# Patient Record
Sex: Female | Born: 1935 | Race: Black or African American | Hispanic: No | Marital: Single | Smoking: Never smoker
Health system: Southern US, Community
[De-identification: ages and names within clinical notes are randomized; demographics above are authoritative.]

## PROBLEM LIST (undated history)

## (undated) DIAGNOSIS — K633 Ulcer of intestine: Secondary | ICD-10-CM

## (undated) DIAGNOSIS — K219 Gastro-esophageal reflux disease without esophagitis: Secondary | ICD-10-CM

## (undated) DIAGNOSIS — M170 Bilateral primary osteoarthritis of knee: Secondary | ICD-10-CM

## (undated) DIAGNOSIS — E119 Type 2 diabetes mellitus without complications: Secondary | ICD-10-CM

## (undated) DIAGNOSIS — I10 Essential (primary) hypertension: Secondary | ICD-10-CM

## (undated) DIAGNOSIS — K449 Diaphragmatic hernia without obstruction or gangrene: Secondary | ICD-10-CM

## (undated) DIAGNOSIS — J302 Other seasonal allergic rhinitis: Secondary | ICD-10-CM

## (undated) HISTORY — PX: WISDOM TOOTH EXTRACTION: SHX21

## (undated) HISTORY — DX: Bilateral primary osteoarthritis of knee: M17.0

## (undated) HISTORY — DX: Type 2 diabetes mellitus without complications: E11.9

## (undated) HISTORY — DX: Essential (primary) hypertension: I10

## (undated) HISTORY — DX: Ulcer of intestine: K63.3

## (undated) HISTORY — PX: KNEE ARTHROSCOPY: SHX127

## (undated) HISTORY — DX: Diaphragmatic hernia without obstruction or gangrene: K44.9

## (undated) HISTORY — PX: DILATION AND CURETTAGE OF UTERUS: SHX78

## (undated) HISTORY — DX: Gastro-esophageal reflux disease without esophagitis: K21.9

## (undated) HISTORY — DX: Other seasonal allergic rhinitis: J30.2

---

## 2015-06-02 ENCOUNTER — Encounter: Payer: Self-pay | Admitting: Physician Assistant

## 2015-06-02 ENCOUNTER — Telehealth: Payer: Self-pay

## 2015-06-02 NOTE — Telephone Encounter (Signed)
Patients niece called to answer questions during pr-visit call. Patient states she is not able to. Niece unsure about several things and states she will call nurse where patient is a resident to get further information.

## 2015-06-03 ENCOUNTER — Encounter: Payer: Self-pay | Admitting: Physician Assistant

## 2015-06-03 ENCOUNTER — Ambulatory Visit (INDEPENDENT_AMBULATORY_CARE_PROVIDER_SITE_OTHER): Payer: Medicare Other | Admitting: Physician Assistant

## 2015-06-03 VITALS — BP 118/68 | HR 80 | Temp 98.3°F | Resp 16 | Ht 60.0 in | Wt 127.5 lb

## 2015-06-03 DIAGNOSIS — E1142 Type 2 diabetes mellitus with diabetic polyneuropathy: Secondary | ICD-10-CM | POA: Diagnosis not present

## 2015-06-03 DIAGNOSIS — K219 Gastro-esophageal reflux disease without esophagitis: Secondary | ICD-10-CM | POA: Diagnosis not present

## 2015-06-03 DIAGNOSIS — I10 Essential (primary) hypertension: Secondary | ICD-10-CM | POA: Diagnosis not present

## 2015-06-03 DIAGNOSIS — K449 Diaphragmatic hernia without obstruction or gangrene: Secondary | ICD-10-CM

## 2015-06-03 DIAGNOSIS — F0390 Unspecified dementia without behavioral disturbance: Secondary | ICD-10-CM

## 2015-06-03 DIAGNOSIS — M170 Bilateral primary osteoarthritis of knee: Secondary | ICD-10-CM

## 2015-06-03 DIAGNOSIS — F0393 Unspecified dementia, unspecified severity, with mood disturbance: Secondary | ICD-10-CM

## 2015-06-03 MED ORDER — ACETAMINOPHEN 325 MG PO TABS: 650.0000 mg | ORAL_TABLET | Freq: Three times a day (TID) | ORAL | Status: DC | PRN

## 2015-06-03 NOTE — Progress Notes (Signed)
Pre visit review using our clinic review tool, if applicable. No additional management support is needed unless otherwise documented below in the visit note/SLS  

## 2015-06-09 NOTE — Progress Notes (Signed)
Patient presents to clinic today to establish care.  Acute Concerns: Patient c/o bilateral knee pain over the past couple of weeks after starting physical therapy. Patient with history of gait imbalance due to neuropathy and chronic osteoarthritis of knees bilaterally. Is taking tylenol rarely for pain.  Chronic Issues: Diabetes Mellitus II with Neuropathy -- Patient endorses taking Januvia 50 mg daily. Is also taking Losartan 100 mg daily. Is not checking sugars. Patient endorses last A1C obtained 2 weeks ago from previous PCP. Will need records. Patient with history of neuropathy causing numbness in her lower legs. Denies falls or problems with sleep. Is not on medication for this and does not want to start another medication. Is not smoking. Denies hc of retinopathy or CKD from diabetes. Endorses flu shot, Prevnar and Zostavax given 3 weeks ago at her Assisted Living community [facility])  GERD with hiatal hernia -- Endorses acid reflux still at bedtime. Is prescribed Ranitidine 150 mg to use twice daily but is not taking as directed. Is eating right before bed. Is requesting referral to Gastroenterology due to history of hiatal hernia.  Hypertension -- Currently on Losartan 100 mg daily, Toproil XL 25 mg daily and 81 mg ASA daily. Patient denies chest pain, palpitations, lightheadedness, dizziness, vision changes or frequent headaches.  BP: 118/68 mmHg   Dementia -- Alert and Oriented x 3 per daughter. Mild issues mainly with recent memory. Is currently on Aricept 5 mg QHS. Daughter states this has most definitely been helping patient.   Health Maintenance: Dental -- Vision -- Immunizations -- Colonoscopy -- Mammogram -- PAP --  Bone Density --    Past Medical History  Diagnosis Date  . Diabetes ([place])   . GERD (gastroesophageal reflux disease)   . Idiopathic small intestinal ulcers   . Seasonal allergies   . Hypertension   . Hiatal hernia   . Degenerative arthritis  of knee, bilateral     Past Surgical History  Procedure Laterality Date  . Dilation and curettage of uterus      x3  . Knee arthroscopy      left  . Wisdom tooth extraction      Current Outpatient Prescriptions on File Prior to Visit  Medication Sig Dispense Refill  . aspirin 81 MG tablet Take 81 mg by mouth daily.    Marland Kitchen donepezil (ARICEPT) 5 MG tablet Take 5 mg by mouth at bedtime.    Marland Kitchen losartan (COZAAR) 100 MG tablet Take 100 mg by mouth daily.    . metoprolol succinate (TOPROL-XL) 25 MG 24 hr tablet Take 25 mg by mouth daily.    . polyethylene glycol (MIRALAX / GLYCOLAX) packet Take 17 g by mouth daily.    . ranitidine (ZANTAC) 150 MG tablet Take by mouth 2 (two) times daily.    . sertraline (ZOLOFT) 25 MG tablet Take 25 mg by mouth daily.    . sitaGLIPtin (JANUVIA) 50 MG tablet Take 50 mg by mouth daily.    Marland Kitchen torsemide (DEMADEX) 10 MG tablet Take 10 mg by mouth daily as needed.      No current facility-administered medications on file prior to visit.    No Known Allergies  Family History  Problem Relation Age of Onset  . Hypertension Mother   . Prostate cancer Father   . Diabetes Mother   . Diabetes Maternal Grandfather   . Tuberculosis Paternal Uncle   . Diabetes Sister     x3  . Stroke Sister  x2  . Arthritis Sister     x2  . Breast cancer Sister     x2  . Hypertension Sister     x5    Social History   Social History  . Marital Status: Single    Spouse Name: N/A  . Number of Children: N/A  . Years of Education: N/A   Occupational History  . Nurse    Social History Main Topics  . Smoking status: Never Smoker   . Smokeless tobacco: Not on file  . Alcohol Use: Not on file  . Drug Use: Not on file  . Sexual Activity: Not on file   Other Topics Concern  . Not on file   Social History Narrative   Review of Systems  Constitutional: Negative for fever and weight loss.  HENT: Negative for ear discharge, ear pain, hearing loss and tinnitus.     Eyes: Negative for blurred vision, double vision, photophobia and pain.  Respiratory: Negative for cough and shortness of breath.   Cardiovascular: Negative for chest pain and palpitations.  Gastrointestinal: Negative for heartburn, nausea, vomiting, abdominal pain, diarrhea, constipation, blood in stool and melena.  Genitourinary: Negative for dysuria, urgency, frequency, hematuria and flank pain.  Musculoskeletal: Positive for joint pain. Negative for falls.  Neurological: Negative for dizziness, loss of consciousness and headaches.  Endo/Heme/Allergies: Negative for environmental allergies.  Psychiatric/Behavioral: Negative for depression, suicidal ideas, hallucinations and substance abuse. The patient is not nervous/anxious and does not have insomnia.     BP 118/68 mmHg  Pulse 80  Temp(Src) 98.3 F (36.8 C) (Oral)  Resp 16  Ht 5' (1.524 m)  Wt 127 lb 8 oz (57.834 kg)  BMI 24.90 kg/m2  SpO2 100%  Physical Exam  Constitutional: She is oriented to person, place, and time and well-developed, well-nourished, and in no distress.  HENT:  Head: Normocephalic and atraumatic.  Right Ear: Tympanic membrane, external ear and ear canal normal.  Left Ear: Tympanic membrane, external ear and ear canal normal.  Nose: Nose normal. No mucosal edema.  Mouth/Throat: Uvula is midline, oropharynx is clear and moist and mucous membranes are normal. No oropharyngeal exudate or posterior oropharyngeal erythema.  Eyes: Conjunctivae are normal. Pupils are equal, round, and reactive to light.  Neck: Neck supple. No thyromegaly present.  Cardiovascular: Normal rate, regular rhythm, normal heart sounds and intact distal pulses.   Pulmonary/Chest: Effort normal and breath sounds normal. No respiratory distress. She has no wheezes. She has no rales.  Abdominal: Soft. Bowel sounds are normal. She exhibits no distension and no mass. There is no tenderness. There is no rebound and no guarding.  Lymphadenopathy:     She has no cervical adenopathy.  Neurological: She is alert and oriented to person, place, and time. No cranial nerve deficit.  Skin: Skin is warm and dry. No rash noted.  Psychiatric: Affect normal.  Vitals reviewed.   No results found for this or any previous visit (from the past 2160 hour(s)).  Assessment/Plan: Essential hypertension, benign Stable. Asymptomatic. Will continue current regimen. Will obtain records from previous PCP for review.   Gastroesophageal reflux disease without esophagitis GERD diet discussed. No late-night evening. Resume Ranitidine BID as directed. Referral placed to GI due to patient request.  Primary osteoarthritis of both knees Discussed PT as cause of exacerbation. Discussed supportive measures -- RICE. Patient declines RX pain medication. Recommended ES Tylenol and topical Aspercreme.  Dementia, senile with depression Continue Aricept and Sertraline daily.  Diabetes ([place]) Up-to-date on immunizations.  Is on ARB therapy. BP well-controlled. Continue current regimen. Will obtain records from previous PCP to review.

## 2015-06-14 ENCOUNTER — Encounter: Payer: Self-pay | Admitting: Physician Assistant

## 2015-06-14 DIAGNOSIS — E119 Type 2 diabetes mellitus without complications: Secondary | ICD-10-CM | POA: Insufficient documentation

## 2015-06-14 DIAGNOSIS — F0393 Unspecified dementia, unspecified severity, with mood disturbance: Secondary | ICD-10-CM | POA: Insufficient documentation

## 2015-06-14 DIAGNOSIS — F0390 Unspecified dementia without behavioral disturbance: Secondary | ICD-10-CM | POA: Insufficient documentation

## 2015-06-14 DIAGNOSIS — I10 Essential (primary) hypertension: Secondary | ICD-10-CM | POA: Insufficient documentation

## 2015-06-14 DIAGNOSIS — K219 Gastro-esophageal reflux disease without esophagitis: Secondary | ICD-10-CM | POA: Insufficient documentation

## 2015-06-14 DIAGNOSIS — M170 Bilateral primary osteoarthritis of knee: Secondary | ICD-10-CM | POA: Insufficient documentation

## 2015-06-14 DIAGNOSIS — K449 Diaphragmatic hernia without obstruction or gangrene: Secondary | ICD-10-CM | POA: Insufficient documentation

## 2015-06-14 NOTE — Assessment & Plan Note (Signed)
GERD diet discussed. No late-night evening. Resume Ranitidine BID as directed. Referral placed to GI due to patient request.

## 2015-06-14 NOTE — Assessment & Plan Note (Signed)
Continue Aricept and Sertraline daily.

## 2015-06-14 NOTE — Assessment & Plan Note (Signed)
Stable. Asymptomatic. Will continue current regimen. Will obtain records from previous PCP for review.

## 2015-06-14 NOTE — Assessment & Plan Note (Signed)
Discussed PT as cause of exacerbation. Discussed supportive measures -- RICE. Patient declines RX pain medication. Recommended ES Tylenol and topical Aspercreme.

## 2015-06-14 NOTE — Assessment & Plan Note (Signed)
Up-to-date on immunizations. Is on ARB therapy. BP well-controlled. Continue current regimen. Will obtain records from previous PCP to review.

## 2015-06-15 ENCOUNTER — Encounter: Payer: Self-pay | Admitting: Gastroenterology

## 2015-06-16 ENCOUNTER — Telehealth: Payer: Self-pay | Admitting: Physician Assistant

## 2015-06-16 DIAGNOSIS — M170 Bilateral primary osteoarthritis of knee: Secondary | ICD-10-CM

## 2015-06-16 NOTE — Telephone Encounter (Signed)
Referral has been placed, please inform caller/SLS Thanks.

## 2015-06-16 NOTE — Telephone Encounter (Signed)
Patients niece called and and told one of the schedulers that Christine Copeland was supposed to have a referral to ortho.  I dont see one  Please advise

## 2015-06-17 ENCOUNTER — Telehealth: Payer: Self-pay | Admitting: Physician Assistant

## 2015-06-17 DIAGNOSIS — E1142 Type 2 diabetes mellitus with diabetic polyneuropathy: Secondary | ICD-10-CM

## 2015-06-17 NOTE — Telephone Encounter (Signed)
Tylenol was ordered and sent to pharmacy given to me by niece. Also added to her list of medications to be given at the facility. They need to check with pharmacy.  Ok to send order over for 20/30 grade compression stockings although I did hand write prescription and give to niece at time of visit along with nursing home papers to give to the staff there.

## 2015-06-17 NOTE — Telephone Encounter (Signed)
LMOM with contact name and number [for return call, if needed] RE: Tylenol Rx to pharmacy and Compression Stocking handwritten Rx given to niece at patient's office visit with facility paperwork; faxed new Rx for compression stockings to [facility]

## 2015-06-17 NOTE — Telephone Encounter (Signed)
Caller name: [name]  Relationship to patient: Nursing home RN   Can be reached: [phone]  Fax [phone]  Reason for call: She says that she was told by pt that PCP prescribed compression stockings and also Tylenol for her. She would like to have someone to fu with her if possible so that she can get order in to pharmacy for pt.

## 2015-06-19 MED ORDER — ACETAMINOPHEN 325 MG PO TABS: 650.0000 mg | ORAL_TABLET | Freq: Three times a day (TID) | ORAL | Status: DC | PRN

## 2015-06-19 NOTE — Addendum Note (Signed)
Addended by: Rockwell Germany on: 06/19/2015 02:30 PM   Modules accepted: Orders

## 2015-06-19 NOTE — Telephone Encounter (Signed)
Received compression stocking order and pharmacy received Rx for Tylenol. [name] is requesting Rx for Tylenol please fax over to [facility] [phone] so the Rx can be placed with the MAR.

## 2015-06-19 NOTE — Telephone Encounter (Signed)
RX for tylenol faxed to requesting facility/SLS

## 2015-06-29 ENCOUNTER — Ambulatory Visit (INDEPENDENT_AMBULATORY_CARE_PROVIDER_SITE_OTHER): Payer: Medicare Other | Admitting: Gastroenterology

## 2015-06-29 ENCOUNTER — Encounter: Payer: Self-pay | Admitting: Gastroenterology

## 2015-06-29 VITALS — BP 102/60 | HR 72 | Ht 61.0 in | Wt 125.0 lb

## 2015-06-29 DIAGNOSIS — K219 Gastro-esophageal reflux disease without esophagitis: Secondary | ICD-10-CM | POA: Diagnosis not present

## 2015-06-29 DIAGNOSIS — R1310 Dysphagia, unspecified: Secondary | ICD-10-CM

## 2015-06-29 NOTE — Progress Notes (Signed)
Christine Copeland    [id number]    25-Aug-1993  Primary Care Physician:Martin, Sandria Manly, PA-C  Referring Physician: Brunetta Jeans, PA-C [address] [place], [place] [address]  Chief complaint:  GERD, dysphagia HPI: 81 year old African-American female here for new patient visit with complaints of intermittent solid dysphagia for past few months. She does snack laying down in bed, and sometimes has difficulty swallowing and chokes. She also reported that she is having some issue with her dentures has an appointment with her dentist tomorrow. Denies any symptoms of food impaction, occasional  regurgitation. No abdominal pain, nausea or vomiting. She has chronic history of GERD, is taking ranitidine at bedtime on most days but sometimes her aides forget to give it to her. Her GERD symptoms are well controlled with Ranitidine, denies heartburn when she takes it.   Outpatient Encounter Prescriptions as of 06/29/2015  Medication Sig  . acetaminophen (TYLENOL) 325 MG tablet Take 2 tablets (650 mg total) by mouth every 8 (eight) hours as needed for moderate pain.  Marland Kitchen aspirin 81 MG tablet Take 81 mg by mouth daily.  . Calcium Carbonate-Vitamin D (CALTRATE 600+D) 600-400 MG-UNIT tablet Take 1 tablet by mouth daily.  Marland Kitchen donepezil (ARICEPT) 5 MG tablet Take 5 mg by mouth at bedtime.  . fluticasone (FLONASE) 50 MCG/ACT nasal spray Place 1 spray into both nostrils daily.  Marland Kitchen ibuprofen (ADVIL,MOTRIN) 200 MG tablet Take 200 mg by mouth every 6 (six) hours as needed.  Marland Kitchen losartan (COZAAR) 100 MG tablet Take 100 mg by mouth daily.  . metoprolol succinate (TOPROL-XL) 25 MG 24 hr tablet Take 25 mg by mouth daily.  . polyethylene glycol (MIRALAX / GLYCOLAX) packet Take 17 g by mouth daily.  . ranitidine (ZANTAC) 150 MG tablet Take by mouth 2 (two) times daily.  . sertraline (ZOLOFT) 25 MG tablet Take 25 mg by mouth daily.  . sitaGLIPtin (JANUVIA) 50 MG tablet Take 50 mg by mouth  daily.  Marland Kitchen torsemide (DEMADEX) 10 MG tablet Take 10 mg by mouth daily as needed.    No facility-administered encounter medications on file as of 06/29/2015.    Allergies as of 06/29/2015  . (No Known Allergies)    Past Medical History  Diagnosis Date  . Diabetes ([place])   . GERD (gastroesophageal reflux disease)   . Idiopathic small intestinal ulcers   . Seasonal allergies   . Hypertension   . Hiatal hernia   . Degenerative arthritis of knee, bilateral     Past Surgical History  Procedure Laterality Date  . Dilation and curettage of uterus      x3  . Knee arthroscopy      left  . Wisdom tooth extraction      Family History  Problem Relation Age of Onset  . Hypertension Mother   . Prostate cancer Father   . Diabetes Mother   . Diabetes Maternal Grandfather   . Tuberculosis Paternal Uncle   . Diabetes Sister     x3  . Stroke Sister     x2  . Arthritis Sister     x2  . Breast cancer Sister     x2  . Hypertension Sister     x5    Social History   Social History  . Marital Status: Single    Spouse Name: N/A  . Number of Children: 0  . Years of Education: N/A   Occupational History  .  retired Marine scientist    Social History Main Topics  . Smoking status: Never Smoker   . Smokeless tobacco: Not on file  . Alcohol Use: Not on file  . Drug Use: Not on file  . Sexual Activity: Not on file   Other Topics Concern  . Not on file   Social History Narrative      Review of systems: Review of Systems  Constitutional: Negative for fever and chills.  HENT: Negative.   Eyes: Negative for blurred vision.  Respiratory: Negative for cough, shortness of breath and wheezing.   Cardiovascular: Negative for chest pain and palpitations.  Gastrointestinal: as per HPI Genitourinary: Negative for dysuria, urgency, frequency and hematuria.  Musculoskeletal: Positive for myalgias, back pain and joint pain.  Skin: Negative for itching and rash.  Neurological: Negative for  dizziness, tremors, focal weakness, seizures and loss of consciousness.  Endo/Heme/Allergies: Negative for environmental allergies.  Psychiatric/Behavioral: Negative for depression, suicidal ideas and hallucinations.  All other systems reviewed and are negative.   Physical Exam: Filed Vitals:   06/29/15 1531  BP: 102/60  Pulse: 72   Gen:      No acute distress, elederly HEENT:  EOMI, sclera anicteric Neck:     No masses; no thyromegaly Lungs:    Clear to auscultation bilaterally; normal respiratory effort CV:         Regular rate and rhythm; no murmurs Abd:      + bowel sounds; soft, non-tender; no palpable masses, no distension, mid line scar Ext:    No edema; adequate peripheral perfusion Skin:      Warm and dry; no rash Neuro: alert and oriented x 3 Psych: normal mood and affect     Assessment and Plan/Recommendations:  81 year old African-American female who is currently at an assisted living facility with complaints of intermittent solid food dysphagia. Advised patient to avoid eating meals laying down and and meals 3 hours before bedtime Sleep with head end elevation Continue ranitidine 300 mg at bedtime We'll schedule for barium esophagram to evaluate the dysphagia and based on the findings we'll consider upper endoscopy Soft diet until her dentures are fixed Return after barium esophagram  Raliegh Ip Denzil Magnuson , MD [phone] Mon-Fri 8a-5p [phone] after 5p, weekends, holidays

## 2015-06-29 NOTE — Patient Instructions (Addendum)
You have been scheduled for a Barium Esophogram at [facility] (1st floor of the hospital) on  07/08/2015 at 9:30am. Please arrive 15 minutes prior to your appointment for registration. Make certain not to have anything to eat or drink 6 hours prior to your test. If you need to reschedule for any reason, please contact radiology at [phone] to do so. __________________________________________________________________ A barium swallow is an examination that concentrates on views of the esophagus. This tends to be a double contrast exam (barium and two liquids which, when combined, create a gas to distend the wall of the oesophagus) or single contrast (non-ionic iodine based). The study is usually tailored to your symptoms so a good history is essential. Attention is paid during the study to the form, structure and configuration of the esophagus, looking for functional disorders (such as aspiration, dysphagia, achalasia, motility and reflux) EXAMINATION You may be asked to change into a gown, depending on the type of swallow being performed. A radiologist and radiographer will perform the procedure. The radiologist will advise you of the type of contrast selected for your procedure and direct you during the exam. You will be asked to stand, sit or lie in several different positions and to hold a small amount of fluid in your mouth before being asked to swallow while the imaging is performed .In some instances you may be asked to swallow barium coated marshmallows to assess the motility of a solid food bolus. The exam can be recorded as a digital or video fluoroscopy procedure. POST PROCEDURE It will take 1-2 days for the barium to pass through your system. To facilitate this, it is important, unless otherwise directed, to increase your fluids for the next 24-48hrs and to resume your normal diet.  This test typically takes about 30 minutes to  perform. __________________________________________________________________________________   Dysphagia Diet Level 1, Pureed The dysphasia level 1 diet includes foods that are completely pureed and smooth. The foods have a pudding-like texture, such as the texture of pureed pancakes, mashed potatoes, and yogurt. The diet does not include foods with lumps or coarse textures. Liquids should be smooth and may either be thin, nectar-thick, honey-like, or spoon-thick. This diet is helpful for people with moderate to severe swallowing problems. It reduces the risk of food getting caught in the windpipe, trachea, or lungs. You may need help or supervision during meals while following this diet. WHAT DO I NEED TO KNOW ABOUT THIS DIET? Foods  You may eat foods that are soft and have a pudding-like texture. If a food does not have this texture, you may be able to eat the food after:  Pureeing it. This can be done with a blender or whisk.  Moistening it with liquid. For example, you may have bread if you soak it in milk or syrup.  Avoid foods that are hard, dry, sticky, chunky, lumpy, or stringy. Also avoid foods with nuts, seeds, raisins, skins, and pulp.  Do not eat foods that you have to chew. If you have to chew the food, then you cannot eat it.  Eat a variety of foods to get all the nutrients you need. Liquids  You may drink liquids that are smooth. Your health care provider will tell you if you should drink thin or thickened liquids.  To thicken a liquid, use a food and beverage thickener or a thickening food. Thickened liquids are usually a "pudding-like" consistency.  Thin liquids include fruit juices, milk, coffee, tea, yogurts, shakes, and similar foods  that melt to thin liquid at room temperature.  Avoid liquids with seeds, pulp, or chunks. See your dietitian or health care provider regularly for help with your dietary changes. WHAT FOODS CAN I EAT? Grains Store-bought soft breads,  pancakes, and [place] toast that have a smooth, moist texture and do not have nuts or seeds (you will need to moisten the food with liquid). Cooked cereals that have a pudding-like consistency, such as cream of wheat or farina (no oatmeal). Pureed, well-cooked pasta, rice, and plain bread stuffing. Vegetables Pureed vegetables. Soft avocado. Smooth tomato paste or sauce. Strained or pureed soups (these may need to be thickened as directed). Mashed or pureed potatoes without skin (can be seasoned with butter, smooth gravy, margarine, or sour cream). Fruits Pureed fruits such as melons and apples without seeds or pulp. Mashed bananas. Smooth tomato paste or sauce. Fruit juices without pulp or seeds. Strained or pureed soups. Meat and Other Protein Sources Pureed meat. Smooth pate or liverwurst. Smooth souffles. Pureed beans (such as lentils). Pureed eggs. Dairy Yogurt. Smooth cheese sauces. Milk (may need to be thickened). Nutritional dairy drinks or shakes. Ask your health care provider whether you can have ice cream. Condiments Finely ground salt, pepper, and other ground spices. Sweets/Desserts Smooth puddings and custards. Pureed desserts. Souffles. Whipped topping. Ask your health care provider whether you can have frozen desserts. Fats and Oils Butter. Margarine. Smooth and strained gravy. Sour cream. Mayonnaise. Cream cheese. Whipped topping. Smooth sauces (such as white sauce, cheese sauce, or hollandaise sauce). The items listed above may not be a complete list of recommended foods or beverages. Contact your dietitian for more options. WHAT FOODS ARE NOT RECOMMENDED? Grains Oatmeal. Dry cereals. Hard breads. Vegetables Whole vegetables. Stringy vegetables (such as celery). Thin tomato sauce. Fruits Whole fresh, frozen, canned, or dried fruits that have not been pureed. Stringy fruits (such as pineapple). Meat and Other Protein Sources Whole or ground meat, fish, or poultry. Dried or  cooked lentils or legumes that have been cooked but not mashed or pureed. Non-pureed eggs. Nuts and seeds. Peanut butter. Dairy Non-pureed cheese. Dairy products with lumps or chunks. Ask your health care provider whether you can have ice cream. Condiments Coarse or seeded herbs and spices. Sweets/Desserts [place] preserves. Jams with seeds. Solid desserts. Sticky, chewy sweets (such as licorice and caramel). Ask your health care provider whether you can have frozen desserts. Fats and Oils Sauces of fats with lumps or chunks. The items listed above may not be a complete list of foods and beverages to avoid. Contact your dietitian for more information.   This information is not intended to replace advice given to you by your health care provider. Make sure you discuss any questions you have with your health care provider.   Document Released: 07/18/2005 Document Revised: 08/08/2014 Document Reviewed: 07/01/2013 Elsevier Interactive Patient Education 2016 Elsevier Inc.   Dysphagia Diet Level 2, Mechanically Altered The dysphagia level 2 diet includes foods that are blended, chopped, ground, or mashed so they are easier to chew and swallow. The foods are soft, moist, and can be chopped into -inch chunks (such as pancakes, pasta, and bananas). In order to be on this diet, you must be able to chew. This diet helps you transition between the pureed textures of the dysphagia level 1 diet to more solid textures. This diet is helpful for people with mild to moderate swallowing difficulties. It reduces the risk of food getting caught in the windpipe, trachea, or lungs.  You may need help or supervision during meals while following this diet so that you eat safely. You will be on this diet until your health care provider advances the texture of your diet.  WHAT DO I NEED TO KNOW ABOUT THIS DIET? Foods  You may eat foods that are soft and moist.  You may need to use a blender, whisk, or masher to  soften some of your foods.  You can moisten foods with gravies, sauces, vegetable or fruit juice, milk, half and half, or water when blending, mashing, or grinding your foods to the right consistency.  If you were on the dysphagia level 1 diet, you may still eat any of the foods included in that diet.  Avoid foods that are dry, hard, sticky, chewy, coarse, and crunchy. Also avoid large cuts of food.  Take small bites. Each bite should contain  inch or less of food. Liquids  Avoid liquids with seeds and chunks.  Thicken liquids, if instructed by your health care provider. Your health care provider will tell you the consistency to which you should thicken your liquids for safe swallowing. To thicken a liquid, use a commercial thickener or a thickening food (such as rice cereal or potato flakes). Ask your health care provider for specific recommendations on thickeners. See your dietitian or health care provider regularly for help with your dietary changes. WHAT FOODS CAN I EAT? Grains Store-bought soft breads that do not have nuts or seeds. Pancakes, sweet rolls, [place] pastries, and [place] toast that have been moistened with syrup or sauce to form a slurry when blended. Well-cooked pasta, noodles, and bread dressing. Well-cooked noodles and pasta in sauce. Moist macaroni and cheese. Soft dumplings or spaetzle with gravy or butter. Cooked cereals (including oatmeal). Low-texture dry cereals, such as rice puff, corn, or wheat-flake cereals, with milk (if thin liquids are not allowed, make sure all of the milk is absorbed by the cereal before eating it). Vegetables Very soft, well-cooked vegetables in pieces less than  inch in size. Cooked potatoes that are moist, not crispy, and with sauce. Fruits Canned or cooked fruits that are soft or moist and do not have skin or seeds. Fresh, soft bananas. Fruit juices with a small amount of pulp (if thin liquids are allowed). Gelatin or plain gelatin with  canned fruit, except pineapple. Meat and Other Protein Sources Tender, moist meats, poultry, or fish cooked with gravy or sauce and cubed to -inch bites or smaller. Ground meat. Moist meatball or meatloaf. Fish without bones. Moist casseroles without rice. Tuna, egg, or meat salad without chunks or hard-to-chew vegetables, such as celery and onions. Smooth quiche without large chunks. Scrambled, poached, or soft-cooked eggs with butter, margarine, sauce, or gravy. Tofu. Well-cooked, moistened and mashed beans, peas, baked beans, and other legumes. Casseroles without rice (such as tuna noodle casserole or soft moist meat lasagna). Dairy Cream cheese. Yogurt. Cottage cheese. Ask your health care provider if milk is allowed. Sweets/Desserts Pudding. Custard. Soft fruit pies with crust on the bottom only. Crisps and cobblers without seeds or nuts and with soft crusts. Soft, moist cakes. Icing. Pre-gelled cookies. Soft, moist cookies dunked in milk, coffee, or another liquid. Jelly. Soft, smooth chocolate bars that are easily chewed. Jams and preserves without seeds. Ask your health care provider whether you can have frozen desserts. Fats and Oils Butter. Margarine. Cream for cereal, depending on liquid consistency allowed. Gravy. Cream sauces. Mayonnaise. Salad dressings. Cream cheese. Cheese spreads, plain or with soft fruits  or vegetables added. Sour cream. Sour cream dips with soft fruits or vegetables added. Whipped toppings. Other Sauces and salsas that have soft chunks that are about  inch or smaller. The items listed above may not be a complete list of recommended foods or beverages. Contact your dietitian for more options. WHAT FOODS ARE NOT RECOMMENDED? Grains All breads not listed in the recommended list. Breads that are hard or have nuts or seeds. Coarse cereals. Cereals that have nuts, seeds, dried fruits, or coconut. Rice. Corn. Vegetables Whole, raw, frozen, or dried vegetables. Tough,  fibrous, chewy, or stringy cooked vegetables, such as celery, peas, broccoli, cabbage, Brussels sprouts, and asparagus. Potato skins. Potato and other vegetable chips. Fried or French-fried potatoes. Cooked corn and peas. Fruits Whole raw, frozen, or dried fruits, including coconut. Pineapple. Fruits with seeds. Meat and Other Protein Sources Dry, tough meats, such as bacon, sausage, and hot dogs. Cheese slices and cubes. Peanut butter. Hard boiled or fried eggs. Nuts. Seeds. Pizza. Sandwiches. Dry casseroles or casseroles with rice or large chunks. Dairy Yogurt with nuts, seeds, or large chunks. Sweets/Desserts Coarse, hard, chewy, or sticky desserts. Any dessert with nuts, seeds, coconut, pineapple, or dried fruit. Ask your health care provider whether you can have frozen desserts. Fats and Oils Avoid fats with chunky, large textures, such as those with nuts or fruits. Other Soups and casseroles with large chunks. The items listed above may not be a complete list of foods and beverages to avoid. Contact your dietitian for more information.   This information is not intended to replace advice given to you by your health care provider. Make sure you discuss any questions you have with your health care provider.   Document Released: 07/18/2005 Document Revised: 08/08/2014 Document Reviewed: 07/01/2013 Elsevier Interactive Patient Education 2016 Elsevier Inc.  Dysphagia Diet Level 3, Mechanically Advanced The dysphagia level 3 diet includes foods that are soft, moist, and can be chopped into 1-inch chunks. This diet is helpful for people with mild swallowing difficulties. It reduces the risk of food getting caught in the windpipe, trachea, or lungs. WHAT DO I NEED TO KNOW ABOUT THIS DIET?  You may eat foods that are soft and moist.  If you were on the dysphagia level 1 or level 2 diets, you may eat any of the foods included on those lists.  Avoid foods that are dry, hard, sticky, chewy,  coarse, and crunchy. Also avoid large cuts of food.  Take small bites. Each bite should contain 1 inch or less of food.  Thicken liquids if instructed by your health care provider. Follow your health care provider's instructions on how to do this and to what consistency.  See your dietitian or speech language pathologist regularly for help with your dietary changes. WHAT FOODS CAN I EAT? Grains Moist breads without nuts or seeds. Biscuits, muffins, pancakes, and waffles well-moistened with syrup, jelly, margarine, or butter. Smooth cereals with plenty of milk to moisten them. Moist bread stuffing. Moist rice. Vegetables All cooked, soft vegetables. Shredded lettuce. Tender fried potatoes. Fruits All canned and cooked fruits. Soft, peeled fresh fruits, such as peaches, nectarines, kiwis, cantaloupe, honeydew melon, and watermelon without seeds. Soft berries, such as strawberries. Meat and Other Protein Sources Moist ground or finely diced or sliced meats. Solid, tender cuts of meat. Meatloaf. Hamburger with a bun. Sausage patty. Deli thin-sliced lunch meat. Chicken, egg, or tuna salad sandwich. Sloppy joe. Moist fish. Eggs prepared any way. Casseroles with small chunks of meats, ground  meats, or tender meats. Dairy Cheese spreads without coarse large chunks. Shredded cheese. Cheese slices. Cottage cheese. Milk at the right texture. Smooth frappes. Yogurt without nuts or coconut. Ask your health care provider whether you can have frozen desserts (such as malts or milk shakes) and thin liquids. Sweets/Desserts Soft, smooth, moist desserts. Non-chewy, smooth candy. Jam. Jelly. Honey. Preserves. Ask your health care provider whether you can have frozen desserts. Fats and Oils Butter. Oils. Margarine. Mayonnaise. Gravy. Spreads. Other All seasonings and sweeteners. All sauces without large chunks. The items listed above may not be a complete list of recommended foods or beverages. Contact your  dietitian for more options. WHAT FOODS ARE NOT RECOMMENDED? Grains Coarse or dry cereals. Dry breads. Toast. Crackers. Tough, crusty breads, such French bread and baguettes. Tough, crisp fried potatoes. Potato skins. Dry bread stuffing. Granola. Popcorn. Chips. Vegetables All raw vegetables except shredded lettuce. Cooked corn. Rubbery or stiff cooked vegetables. Stringy vegetables, such as celery. Fruits Hard fruits that are difficult to chew, such as apples or pears. Stringy, high-pulp fruits, such as pineapple, papaya, or mango. Fruits with tough skins, such as grapes. Coconut. All dried fruits. Fruit leather. Fruit roll-ups. Fruit snacks. Meat and Other Protein Sources Dry or tough meats or poultry. Dry fish. Fish with bones. Peanut butter. All nuts and seeds. Dairy  Any with nuts, seeds, chocolate chips, dried fruit, coconut, or pineapple. Sweets/Desserts Dry cakes. Chewy or dry cookies. Any with nuts, seeds, dry fruits, coconut, pineapple, or anything dry, sticky, or hard. Chewy caramel. Licorice. Taffy-type candies. Ask your health care provider whether you can have frozen desserts. Fats and Oils Any with chunks, nuts, seeds, or pineapple. Olives. Angie Fava. Other Soups with tough or large chunks of meats, poultry, or vegetables. Corn or clam chowder. The items listed above may not be a complete list of foods and beverages to avoid. Contact your dietitian for more information.   This information is not intended to replace advice given to you by your health care provider. Make sure you discuss any questions you have with your health care provider.   Document Released: 07/18/2005 Document Revised: 08/08/2014 Document Reviewed: 07/01/2013 Elsevier Interactive Patient Education [employer].    It is ok for patient to give herself  zantac on an as needed basis

## 2015-07-08 ENCOUNTER — Ambulatory Visit (HOSPITAL_COMMUNITY): Payer: Medicare Other

## 2015-07-13 ENCOUNTER — Ambulatory Visit (HOSPITAL_COMMUNITY)
Admission: RE | Admit: 2015-07-13 | Discharge: 2015-07-13 | Disposition: A | Discharge status: Home / Self Care | Payer: Medicare Other | Source: Ambulatory Visit | Attending: Gastroenterology | Admitting: Gastroenterology

## 2015-07-13 DIAGNOSIS — R1310 Dysphagia, unspecified: Secondary | ICD-10-CM

## 2015-07-13 DIAGNOSIS — K219 Gastro-esophageal reflux disease without esophagitis: Secondary | ICD-10-CM | POA: Insufficient documentation

## 2015-07-30 ENCOUNTER — Other Ambulatory Visit (HOSPITAL_COMMUNITY)
Admission: RE | Admit: 2015-07-30 | Discharge: 2015-07-30 | Disposition: A | Discharge status: Home / Self Care | Payer: Medicare Other | Source: Ambulatory Visit | Attending: Family Medicine | Admitting: Family Medicine

## 2015-07-30 ENCOUNTER — Encounter: Payer: Self-pay | Admitting: Family Medicine

## 2015-07-30 ENCOUNTER — Ambulatory Visit (INDEPENDENT_AMBULATORY_CARE_PROVIDER_SITE_OTHER): Payer: Medicare Other | Admitting: Family Medicine

## 2015-07-30 VITALS — BP 110/68 | HR 91 | Temp 98.4°F | Resp 17 | Ht 61.0 in | Wt 123.2 lb

## 2015-07-30 DIAGNOSIS — N760 Acute vaginitis: Secondary | ICD-10-CM | POA: Insufficient documentation

## 2015-07-30 DIAGNOSIS — R319 Hematuria, unspecified: Secondary | ICD-10-CM | POA: Diagnosis not present

## 2015-07-30 DIAGNOSIS — R300 Dysuria: Secondary | ICD-10-CM | POA: Diagnosis not present

## 2015-07-30 LAB — POCT URINALYSIS DIPSTICK
BILIRUBIN UA: NEGATIVE
Ketones, UA: NEGATIVE
Nitrite, UA: NEGATIVE
Protein, UA: NEGATIVE
SPEC GRAV UA: 1.015
UROBILINOGEN UA: 0.2
pH, UA: 6.5

## 2015-07-30 MED ORDER — MICONAZOLE NITRATE 2 % VA CREA: TOPICAL_CREAM | VAGINAL | Status: DC

## 2015-07-30 MED ORDER — FLUCONAZOLE 150 MG PO TABS: 150.0000 mg | ORAL_TABLET | Freq: Once | ORAL | Status: DC

## 2015-07-30 NOTE — Patient Instructions (Signed)
Follow up as needed Take the 1 time diflucan dose today to treat yeast Use the topical Miconazole cream externally on the labia twice daily until area improves We'll notify you of your lab results and the urine results If labs are normal and symptoms are not improving with treatment, please let me know so we can refer to Dr Edwinna Areola Call with any questions or concerns Hang in there! Happy New Year!!!

## 2015-07-30 NOTE — Progress Notes (Signed)
   Subjective:    Patient ID: Christine Copeland, female    DOB: Apr 07, 1925, 81 y.o.   MRN: [id number]  HPI Dysuria- pt reports sxs started 'a few months ago'.  Denies increased urinary frequency, urgency hesitancy.  Pt will rarely have discomfort when not urinating but much more discomfort w/ urination.  Denies suprapubic pressure, CVA tenderness.  No fevers.  Pt denies urinary leakage or need to wear liner.  Pt is diabetic.   Review of Systems For ROS see HPI     Objective:   Physical Exam  Constitutional: She appears well-developed and well-nourished. No distress.  HENT:  Head: Normocephalic and atraumatic.  Abdominal: Soft. Bowel sounds are normal. She exhibits no distension. There is no tenderness (no suprapubic or CVA tenderness).  Genitourinary:  Labia coated w/ thick, white discharge w/ underlying erythema consistent w/ possible yeast  Neurological: She is alert.  Skin: Skin is warm and dry.  Psychiatric: She has a normal mood and affect. Her behavior is normal. Thought content normal.  Vitals reviewed.         Assessment & Plan:

## 2015-07-30 NOTE — Progress Notes (Signed)
Pre visit review using our clinic review tool, if applicable. No additional management support is needed unless otherwise documented below in the visit note. 

## 2015-07-30 NOTE — Assessment & Plan Note (Signed)
New.  Pt's duration of sxs makes UTI less likely but will send urine for culture.  PE more consistent w/ yeast vaginitis that would account for the burning w/ urination.  Wet prep collected.  Start topical miconazole and pt to have Diflucan x1 dose.  Reviewed red flags w/ daughter that should prompt return- she expressed understanding.

## 2015-07-31 LAB — URINE CULTURE: Colony Count: >=100000

## 2015-07-31 LAB — CERVICOVAGINAL ANCILLARY ONLY: Wet Prep (BD Affirm): POSITIVE — AB

## 2015-08-31 ENCOUNTER — Other Ambulatory Visit: Payer: Self-pay | Admitting: Physician Assistant

## 2015-09-10 ENCOUNTER — Ambulatory Visit (INDEPENDENT_AMBULATORY_CARE_PROVIDER_SITE_OTHER): Payer: Medicare Other | Admitting: Family Medicine

## 2015-09-10 ENCOUNTER — Encounter: Payer: Medicare Other | Admitting: Medical

## 2015-09-10 VITALS — BP 124/70 | HR 95 | Temp 98.5°F | Resp 16 | Ht 59.0 in | Wt 114.8 lb

## 2015-09-10 DIAGNOSIS — R300 Dysuria: Secondary | ICD-10-CM | POA: Diagnosis not present

## 2015-09-10 DIAGNOSIS — B373 Candidiasis of vulva and vagina: Secondary | ICD-10-CM | POA: Diagnosis not present

## 2015-09-10 DIAGNOSIS — N3090 Cystitis, unspecified without hematuria: Secondary | ICD-10-CM | POA: Diagnosis not present

## 2015-09-10 DIAGNOSIS — B3731 Acute candidiasis of vulva and vagina: Secondary | ICD-10-CM

## 2015-09-10 LAB — POCT URINALYSIS DIP (MANUAL ENTRY)
Bilirubin, UA: NEGATIVE
Glucose, UA: >=1000 — AB
NITRITE UA: NEGATIVE
PH UA: 7
RBC UA: NEGATIVE
Spec Grav, UA: 1.02
UROBILINOGEN UA: 1

## 2015-09-10 LAB — POC MICROSCOPIC URINALYSIS (UMFC): Mucus: ABSENT

## 2015-09-10 MED ORDER — FLUCONAZOLE 150 MG PO TABS: 150.0000 mg | ORAL_TABLET | Freq: Once | ORAL | Status: DC

## 2015-09-10 MED ORDER — CIPROFLOXACIN HCL 250 MG PO TABS: 250.0000 mg | ORAL_TABLET | Freq: Two times a day (BID) | ORAL | Status: DC

## 2015-09-10 NOTE — Progress Notes (Signed)
Subjective:    Patient ID: Christine Copeland, female    DOB: 1925/03/07, 82 y.o.   MRN: [id number]  HPI This is a pleasant 82 yo female who presents today with several days of lower abdominal pain, vaginal irritation. The patient is a resident at [facility] and is brought in by her daughter. The patient typically sees Dr. Hassell Done for primary care. The patient was seen 07/30/15 with vaginal yeast and treated with Diflucan and Monistat 7 with resolution of symptoms. Patient is on Januvia for diabetes.  Past Medical History  Diagnosis Date  . Diabetes ([place])   . GERD (gastroesophageal reflux disease)   . Idiopathic small intestinal ulcers   . Seasonal allergies   . Hypertension   . Hiatal hernia   . Degenerative arthritis of knee, bilateral    Past Surgical History  Procedure Laterality Date  . Dilation and curettage of uterus      x3  . Knee arthroscopy      left  . Wisdom tooth extraction     Family History  Problem Relation Age of Onset  . Hypertension Mother   . Prostate cancer Father   . Diabetes Mother   . Diabetes Maternal Grandfather   . Tuberculosis Paternal Uncle   . Diabetes Sister     x3  . Stroke Sister     x2  . Arthritis Sister     x2  . Breast cancer Sister     x2  . Hypertension Sister     x5   Social History  Substance Use Topics  . Smoking status: Never Smoker   . Smokeless tobacco: None  . Alcohol Use: None    Review of Systems  Constitutional: Positive for fatigue (for several years, no recent changes). Negative for fever and chills.  Respiratory: Negative for cough, chest tightness and shortness of breath.   Cardiovascular: Negative for chest pain and leg swelling.  Gastrointestinal: Positive for abdominal pain (Chronic for many years, worse last night, better today. ). Negative for nausea, vomiting, diarrhea and constipation.  Genitourinary: Positive for vaginal discharge (more yesterday, white) and vaginal pain (feels irritated). Negative  for frequency, hematuria and flank pain.  Neurological: Negative for dizziness and light-headedness.       Objective:   Physical Exam  Constitutional: No distress.  She is thin and appears frail. She is using a rolling walker for ambulation.   HENT:  Head: Normocephalic and atraumatic.  Eyes: Conjunctivae are normal.  Neck: Normal range of motion. Neck supple.  Cardiovascular: Normal rate, regular rhythm and normal heart sounds.   Pulmonary/Chest: Effort normal and breath sounds normal.  Abdominal: Soft. She exhibits no distension. There is no tenderness.  Genitourinary: Vaginal discharge (small amount thick white, perineum without irritation. ) found.  Musculoskeletal: She exhibits no edema.  Neurological: She is alert.  Answers questions appropriately.   Skin: Skin is warm and dry. She is not diaphoretic.  Psychiatric: She has a normal mood and affect. Her behavior is normal. Judgment and thought content normal.  Vitals reviewed.     BP 124/70 mmHg  Pulse 95  Temp(Src) 98.5 F (36.9 C)  Resp 16  Ht 4\' 11"  (1.499 m)  Wt 114 lb 12.8 oz (52.073 kg)  BMI 23.17 kg/m2  SpO2 98% Results for orders placed or performed in visit on 09/10/15  POCT urinalysis dipstick  Result Value Ref Range   Color, UA yellow yellow   Clarity, UA cloudy (A) clear   Glucose, UA >=  1,000 (A) negative   Bilirubin, UA negative negative   Ketones, POC UA small (15) (A) negative   Spec Grav, UA 1.020    Blood, UA negative negative   pH, UA 7.0    Protein Ur, POC =30 (A) negative   Urobilinogen, UA 1.0    Nitrite, UA Negative Negative   Leukocytes, UA small (1+) (A) Negative  POCT Microscopic Urinalysis ([facility])  Result Value Ref Range   WBC,UR,HPF,POC Moderate (A) None WBC/hpf   RBC,UR,HPF,POC Few (A) None RBC/hpf   Bacteria Few (A) None, Too numerous to count   Mucus Absent Absent   Epithelial Cells, UR Per Microscopy Few (A) None, Too numerous to count cells/hpf       Assessment & Plan:    1. Dysuria - POCT urinalysis dipstick - POCT Microscopic Urinalysis ([facility]) - ciprofloxacin (CIPRO) 250 MG tablet; Take 1 tablet (250 mg total) by mouth 2 (two) times daily.  Dispense: 6 tablet; Refill: 0  2. Vaginal candida - fluconazole (DIFLUCAN) 150 MG tablet; Take 1 tablet (150 mg total) by mouth once. Repeat if needed  Dispense: 2 tablet; Refill: 0 - the patient's daughter will get her some topical treatment   3. Cystitis - Provided written and verbal information regarding diagnosis and treatment. - RTC precautions reviewed - Urine culture  - discussed potential role of Januvia in cystitis/vaginitis  Clarene Reamer, FNP-BC  [facility]  09/10/2015 1:24 PM

## 2015-09-10 NOTE — Progress Notes (Signed)
This encounter was created in error - please disregard.

## 2015-09-10 NOTE — Patient Instructions (Signed)
Please drink between 4-5 glasses of noncaloric liquids today and tomorrow  Please come back in to clinic if you develop a fever over 100, worsening pain or if you are not better in 3-4 days.  Urinary Tract Infection Urinary tract infections (UTIs) can develop anywhere along your urinary tract. Your urinary tract is your body's drainage system for removing wastes and extra water. Your urinary tract includes two kidneys, two ureters, a bladder, and a urethra. Your kidneys are a pair of bean-shaped organs. Each kidney is about the size of your fist. They are located below your ribs, one on each side of your spine. CAUSES Infections are caused by microbes, which are microscopic organisms, including fungi, viruses, and bacteria. These organisms are so small that they can only be seen through a microscope. Bacteria are the microbes that most commonly cause UTIs. SYMPTOMS  Symptoms of UTIs may vary by age and gender of the patient and by the location of the infection. Symptoms in young women typically include a frequent and intense urge to urinate and a painful, burning feeling in the bladder or urethra during urination. Older women and men are more likely to be tired, shaky, and weak and have muscle aches and abdominal pain. A fever may mean the infection is in your kidneys. Other symptoms of a kidney infection include pain in your back or sides below the ribs, nausea, and vomiting. DIAGNOSIS To diagnose a UTI, your caregiver will ask you about your symptoms. Your caregiver will also ask you to provide a urine sample. The urine sample will be tested for bacteria and white blood cells. White blood cells are made by your body to help fight infection. TREATMENT  Typically, UTIs can be treated with medication. Because most UTIs are caused by a bacterial infection, they usually can be treated with the use of antibiotics. The choice of antibiotic and length of treatment depend on your symptoms and the type of  bacteria causing your infection. HOME CARE INSTRUCTIONS  If you were prescribed antibiotics, take them exactly as your caregiver instructs you. Finish the medication even if you feel better after you have only taken some of the medication.  Drink enough water and fluids to keep your urine clear or pale yellow.  Avoid caffeine, tea, and carbonated beverages. They tend to irritate your bladder.  Empty your bladder often. Avoid holding urine for long periods of time.  Empty your bladder before and after sexual intercourse.  After a bowel movement, women should cleanse from front to back. Use each tissue only once. SEEK MEDICAL CARE IF:   You have back pain.  You develop a fever.  Your symptoms do not begin to resolve within 3 days. SEEK IMMEDIATE MEDICAL CARE IF:   You have severe back pain or lower abdominal pain.  You develop chills.  You have nausea or vomiting.  You have continued burning or discomfort with urination. MAKE SURE YOU:   Understand these instructions.  Will watch your condition.  Will get help right away if you are not doing well or get worse.   This information is not intended to replace advice given to you by your health care provider. Make sure you discuss any questions you have with your health care provider.   Document Released: 04/27/2005 Document Revised: 04/08/2015 Document Reviewed: 08/26/2011 Elsevier Interactive Patient Education [employer].

## 2015-09-11 ENCOUNTER — Telehealth: Payer: Self-pay | Admitting: Medical

## 2015-09-11 ENCOUNTER — Encounter: Payer: Self-pay | Admitting: Medical

## 2015-09-11 LAB — URINE CULTURE

## 2015-09-11 NOTE — Telephone Encounter (Signed)
Marked to charge and mailing no show letter °

## 2015-09-11 NOTE — Telephone Encounter (Signed)
charge 

## 2015-09-11 NOTE — Telephone Encounter (Signed)
Pt was no show 09/10/15 11:00am for acute appt, per chart pt went to [facility] same day, charge or no charge?

## 2015-09-25 ENCOUNTER — Encounter: Payer: Self-pay | Admitting: Gastroenterology

## 2015-09-25 ENCOUNTER — Ambulatory Visit (INDEPENDENT_AMBULATORY_CARE_PROVIDER_SITE_OTHER): Payer: Medicare Other | Admitting: Gastroenterology

## 2015-09-25 VITALS — BP 100/60 | HR 96 | Ht 59.0 in | Wt 113.0 lb

## 2015-09-25 DIAGNOSIS — K219 Gastro-esophageal reflux disease without esophagitis: Secondary | ICD-10-CM | POA: Diagnosis not present

## 2015-09-25 NOTE — Progress Notes (Signed)
Christine Copeland    [id number]    1924-10-15  Primary Care Physician:Martin, Sandria Manly, PA-C  Referring Physician: Brunetta Jeans, PA-C [address], [place] [address]  Chief complaint: Decreased appetite and weight loss  HPI:  82 year old African-American female here for follow-up visit after barium esophagram. She doesn't complain of any swallowing difficulty today. She has decreased appetite mainly because of her living situation, she feels stressed to go down to the dining hall and many times skips meals. Her grandniece is trying to change her living situation.  Denies any symptoms of food impaction, occasional  regurgitation. No abdominal pain, nausea or vomiting.  Her GERD symptoms are well controlled with Ranitidine, denies heartburn when she takes it. There are no specific GI symptoms at this visit   Outpatient Encounter Prescriptions as of 09/25/2015  Medication Sig  . acetaminophen (TYLENOL) 325 MG tablet Take 2 tablets (650 mg total) by mouth every 8 (eight) hours as needed for moderate pain. (Patient not taking: Reported on 09/10/2015)  . aspirin 81 MG tablet Take 81 mg by mouth daily.  . Calcium Carbonate-Vitamin D (CALTRATE 600+D) 600-400 MG-UNIT tablet Take 1 tablet by mouth daily.  Marland Kitchen donepezil (ARICEPT) 5 MG tablet Take 5 mg by mouth at bedtime.  . fluticasone (FLONASE) 50 MCG/ACT nasal spray Place 1 spray into both nostrils daily.  Marland Kitchen losartan (COZAAR) 100 MG tablet Take 100 mg by mouth daily.  . metoprolol succinate (TOPROL-XL) 25 MG 24 hr tablet TAKE ONE-HALF TABLET DAILY  . miconazole (MONISTAT 7) 2 % vaginal cream Apply topically twice daily until area improves (Patient not taking: Reported on 09/10/2015)  . polyethylene glycol (MIRALAX / GLYCOLAX) packet Take 17 g by mouth daily.  . ranitidine (ZANTAC) 150 MG tablet Take by mouth 2 (two) times daily. Reported on 09/10/2015  . sertraline (ZOLOFT) 25 MG tablet TAKE ONE-HALF TABLET DAILY    . sitaGLIPtin (JANUVIA) 50 MG tablet Take 50 mg by mouth daily.  Marland Kitchen torsemide (DEMADEX) 10 MG tablet Take 10 mg by mouth daily as needed. Reported on 09/10/2015  . [DISCONTINUED] amoxicillin (AMOXIL) 500 MG capsule   . [DISCONTINUED] cetirizine (ZYRTEC) 10 MG tablet   . [DISCONTINUED] ciprofloxacin (CIPRO) 250 MG tablet Take 1 tablet (250 mg total) by mouth 2 (two) times daily.  . [DISCONTINUED] fluconazole (DIFLUCAN) 150 MG tablet Take 1 tablet (150 mg total) by mouth once. Repeat if needed  . [DISCONTINUED] ibuprofen (ADVIL,MOTRIN) 200 MG tablet Take 200 mg by mouth every 6 (six) hours as needed. Reported on 09/10/2015   No facility-administered encounter medications on file as of 09/25/2015.    Allergies as of 09/25/2015  . (No Known Allergies)    Past Medical History  Diagnosis Date  . Diabetes ([place])   . GERD (gastroesophageal reflux disease)   . Idiopathic small intestinal ulcers   . Seasonal allergies   . Hypertension   . Hiatal hernia   . Degenerative arthritis of knee, bilateral     Past Surgical History  Procedure Laterality Date  . Dilation and curettage of uterus      x3  . Knee arthroscopy      left  . Wisdom tooth extraction      Family History  Problem Relation Age of Onset  . Hypertension Mother   . Prostate cancer Father   . Diabetes Mother   . Diabetes Maternal Grandfather   . Tuberculosis Paternal Uncle   .  Diabetes Sister     x3  . Stroke Sister     x2  . Arthritis Sister     x2  . Breast cancer Sister     x2  . Hypertension Sister     x5    Social History   Social History  . Marital Status: Single    Spouse Name: N/A  . Number of Children: 0  . Years of Education: N/A   Occupational History  . retired Marine scientist    Social History Main Topics  . Smoking status: Never Smoker   . Smokeless tobacco: Not on file  . Alcohol Use: Not on file  . Drug Use: Not on file  . Sexual Activity: Not on file   Other Topics Concern  . Not on file    Social History Narrative      Review of systems: Review of Systems  Constitutional: Negative for fever and chills.  HENT: Negative.   Eyes: Negative for blurred vision.  Respiratory: Negative for cough, shortness of breath and wheezing.   Cardiovascular: Negative for chest pain and palpitations.  Gastrointestinal: as per HPI Genitourinary: Negative for dysuria, urgency, frequency and hematuria.  Musculoskeletal: Negative for myalgias, back pain and joint pain.  Skin: Negative for itching and rash.  Neurological: Negative for dizziness, tremors, focal weakness, seizures and loss of consciousness.  Endo/Heme/Allergies: Negative for environmental allergies.  Psychiatric/Behavioral: Negative for depression, suicidal ideas and hallucinations.  All other systems reviewed and are negative.   Physical Exam: There were no vitals filed for this visit. Gen:      No acute distress HEENT:  EOMI, sclera anicteric Neck:     No masses; no thyromegaly Lungs:    Clear to auscultation bilaterally; normal respiratory effort CV:         Regular rate and rhythm; no murmurs Abd:      + bowel sounds; soft, non-tender; no palpable masses, no distension Ext:    No edema; adequate peripheral perfusion Skin:      Warm and dry; no rash Neuro: alert and oriented x 3 Psych: normal mood and affect  Data Reviewed: Barium Esophagogram 07/13/15 No penetration or aspiration with swelling. There is decreased esophageal motility. No esophageal fold thickening, stricture or obstruction. Small hiatal hernia. A 13 mm barium tablet passed into the stomach.  IMPRESSION: Decreased esophageal motility.   Assessment and Plan/Recommendations: 82 year old female with history of chronic GERD symptoms well controlled on H2 blocker here for follow-up visit She no longer has dysphagia She continues to have weight loss mainly because of decreased by mouth intake due to the stress of her living situation She is in  the process of looking into a new place Continue antireflux measures Return as needed K. Denzil Magnuson , [place] [phone] Mon-Fri 8a-5p [phone] after 5p, weekends, holidays

## 2015-09-25 NOTE — Patient Instructions (Signed)
Please take Miralax 1 capful daily in a galss of liquid.  Follow up with Dr. Silverio Decamp as needed.

## 2015-10-05 ENCOUNTER — Other Ambulatory Visit: Payer: Self-pay | Admitting: Physician Assistant

## 2015-10-19 ENCOUNTER — Telehealth: Payer: Self-pay | Admitting: Physician Assistant

## 2015-10-19 ENCOUNTER — Telehealth: Payer: Self-pay | Admitting: *Deleted

## 2015-10-19 NOTE — Telephone Encounter (Signed)
Spoke with OT at [facility]. Verbal order granted. Personally faxed over written order. They will fax [place] the assessment.

## 2015-10-19 NOTE — Telephone Encounter (Signed)
Received OT Plan of Care; forwarded to provider/SLS 03/20

## 2015-10-19 NOTE — Telephone Encounter (Signed)
Caller name:Kathy Relationship to patient:[facility] Can be reached:[phone] Pharmacy:  Reason for call:Requesting order for OT, pt fell 2x times back to back last week. Please fax order to [phone]

## 2015-10-20 NOTE — Telephone Encounter (Signed)
Received PT Plan of Care; forwarded to provider/SLSl 03/21

## 2015-10-21 ENCOUNTER — Telehealth: Payer: Self-pay | Admitting: *Deleted

## 2015-10-21 ENCOUNTER — Telehealth: Payer: Self-pay | Admitting: Physician Assistant

## 2015-10-21 NOTE — Telephone Encounter (Signed)
Received Consultation Report RE: need for continuation and/or Sig change for Ranitidine 150 mg; forwarded to provider/SLS 03/22

## 2015-10-21 NOTE — Telephone Encounter (Signed)
Spoke with Tye Maryland at [facility]  Patient with orthostatic hypotension. Concern about hw she is taking medications. Also patient has stopped eating and is having a depressed mood. Attempted to contact caregiver/POA to discuss need for appointment. No answer and voicemail not accepting new messages at time.   Please call patient and caregiver to schedule an appointment. If any alarm signs, send to ER. Otherwise they need to come see [place] today or tomorrow for assessment and medication changes as I have only seen patient once.  Tye Maryland notified that if any worsening symptoms when she sees patient later today, they are to help facilitate her transport to ER.

## 2015-10-21 NOTE — Telephone Encounter (Signed)
Scheduled Friday for 30 min - caregiver called in

## 2015-10-21 NOTE — Telephone Encounter (Signed)
Called left message for return call regarding appointment to be scheduled for patient.

## 2015-10-21 NOTE — Telephone Encounter (Signed)
Caller name: Tye Maryland  Relation to pt: Director of Therapy department from [facility]  Call back number: [phone]   Reason for call:   Would like to inform PCP patient is orthostatic when standing. Yesterday when sitting BP 138/72  and today sitting 98/62 . Patient is sleeping  a lot,  light headed and never the one to take naps, patient complaining about  being stressed and depressed. Would like to know if there's any BP guidelines and any medication changes/RX need to go to [employer] [facility] (f) [phone]. Tye Maryland stated POA informed and would like to discuss with PA.

## 2015-10-21 NOTE — Telephone Encounter (Signed)
Received Incident Report for fall; forwarded to provider/SLS 03/22

## 2015-10-21 NOTE — Telephone Encounter (Signed)
Thank you Christine Copeland. I appreciate it.

## 2015-10-23 ENCOUNTER — Encounter: Payer: Self-pay | Admitting: Physician Assistant

## 2015-10-23 ENCOUNTER — Telehealth: Payer: Self-pay | Admitting: *Deleted

## 2015-10-23 ENCOUNTER — Ambulatory Visit (INDEPENDENT_AMBULATORY_CARE_PROVIDER_SITE_OTHER): Payer: Medicare Other | Admitting: Physician Assistant

## 2015-10-23 VITALS — BP 100/58 | HR 92 | Temp 97.8°F | Ht 59.0 in | Wt 107.2 lb

## 2015-10-23 DIAGNOSIS — F0390 Unspecified dementia without behavioral disturbance: Secondary | ICD-10-CM | POA: Diagnosis not present

## 2015-10-23 DIAGNOSIS — R630 Anorexia: Secondary | ICD-10-CM

## 2015-10-23 DIAGNOSIS — E1142 Type 2 diabetes mellitus with diabetic polyneuropathy: Secondary | ICD-10-CM

## 2015-10-23 DIAGNOSIS — F0393 Unspecified dementia, unspecified severity, with mood disturbance: Secondary | ICD-10-CM

## 2015-10-23 DIAGNOSIS — I10 Essential (primary) hypertension: Secondary | ICD-10-CM

## 2015-10-23 LAB — CBC WITH DIFFERENTIAL/PLATELET
BASOS PCT: 0.2 % (ref 0.0–3.0)
Basophils Absolute: 0 10*3/uL (ref 0.0–0.1)
EOS ABS: 0 10*3/uL (ref 0.0–0.7)
EOS PCT: 0.3 % (ref 0.0–5.0)
HEMATOCRIT: 36.8 % (ref 36.0–46.0)
HEMOGLOBIN: 11.7 g/dL — AB (ref 12.0–15.0)
LYMPHS PCT: 11.5 % — AB (ref 12.0–46.0)
Lymphs Abs: 0.9 10*3/uL (ref 0.7–4.0)
MCHC: 31.7 g/dL (ref 30.0–36.0)
MCV: 88.1 fl (ref 78.0–100.0)
MONOS PCT: 11.2 % (ref 3.0–12.0)
Monocytes Absolute: 0.9 10*3/uL (ref 0.1–1.0)
Neutro Abs: 6.1 10*3/uL (ref 1.4–7.7)
Neutrophils Relative %: 76.8 % (ref 43.0–77.0)
Platelets: 226 10*3/uL (ref 150.0–400.0)
RBC: 4.18 Mil/uL (ref 3.87–5.11)
RDW: 13.9 % (ref 11.5–15.5)
WBC: 7.9 10*3/uL (ref 4.0–10.5)

## 2015-10-23 LAB — COMPREHENSIVE METABOLIC PANEL
ALT: 37 U/L — AB (ref 0–35)
AST: 25 U/L (ref 0–37)
Albumin: 3.6 g/dL (ref 3.5–5.2)
Alkaline Phosphatase: 186 U/L — ABNORMAL HIGH (ref 39–117)
BUN: 21 mg/dL (ref 6–23)
CO2: 28 meq/L (ref 19–32)
Calcium: 9.7 mg/dL (ref 8.4–10.5)
Chloride: 94 mEq/L — ABNORMAL LOW (ref 96–112)
Creatinine, Ser: 0.72 mg/dL (ref 0.40–1.20)
GFR: 98.13 mL/min (ref >60.00)
GLUCOSE: 381 mg/dL — AB (ref 70–99)
POTASSIUM: 4.2 meq/L (ref 3.5–5.1)
SODIUM: 131 meq/L — AB (ref 135–145)
TOTAL PROTEIN: 7 g/dL (ref 6.0–8.3)
Total Bilirubin: 0.7 mg/dL (ref 0.2–1.2)

## 2015-10-23 LAB — HEMOGLOBIN A1C: Hgb A1c MFr Bld: 12.4 % — ABNORMAL HIGH (ref 4.6–6.5)

## 2015-10-23 MED ORDER — LOSARTAN POTASSIUM 50 MG PO TABS: 50.0000 mg | ORAL_TABLET | Freq: Every day | ORAL | Status: DC

## 2015-10-23 MED ORDER — RANITIDINE HCL 150 MG PO TABS: 150.0000 mg | ORAL_TABLET | Freq: Two times a day (BID) | ORAL | Status: AC

## 2015-10-23 MED ORDER — GLIMEPIRIDE 1 MG PO TABS
1.0000 mg | ORAL_TABLET | Freq: Every day | ORAL | Status: DC
Start: 2015-10-23 — End: 2015-10-26

## 2015-10-23 MED ORDER — DONEPEZIL HCL 10 MG PO TABS: 10.0000 mg | ORAL_TABLET | Freq: Every day | ORAL | Status: AC

## 2015-10-23 NOTE — Telephone Encounter (Addendum)
Called and spoke with the pt caregiver (BJ) and informed her of recent lab results and note.   She verbalized understanding and agreed.  New prescription sent to the pharmacy by e-script.  Pt was scheduled a follow-up appt for (Monday-10/26/15).//AB/CMA

## 2015-10-23 NOTE — Progress Notes (Signed)
Pre visit review using our clinic review tool, if applicable. No additional management support is needed unless otherwise documented below in the visit note. 

## 2015-10-23 NOTE — Progress Notes (Signed)
Patient with history of hypertension, dementia and diabetes mellitus presents to clinic today with her caregiver (niece) for follow-up due to some concerns expressed by her Physical Therapist at [facility], an assisted living community. PT has noted episodes of orthostatic hypotension and that the patient seems withdrawn from activities in the community. Patient does note some lightheadedness with standing. Denies depressed mood but is still having mild issue with memory. Endorses taking medication as directed. Caregiver confirms this as she stated the nursing staff at [facility] administers medications for patient. Patient endorses decreased appetite but states she stays well hydrated. Denies notable pains. Endorses good bowel and urinary output.   Past Medical History  Diagnosis Date  . Diabetes ([place])   . GERD (gastroesophageal reflux disease)   . Idiopathic small intestinal ulcers   . Seasonal allergies   . Hypertension   . Hiatal hernia   . Degenerative arthritis of knee, bilateral     Current Outpatient Prescriptions on File Prior to Visit  Medication Sig Dispense Refill  . Calcium Carbonate-Vitamin D (CALTRATE 600+D) 600-400 MG-UNIT tablet Take 1 tablet by mouth daily.    . fluticasone (FLONASE) 50 MCG/ACT nasal spray Place 1 spray into both nostrils at bedtime.     . metoprolol succinate (TOPROL-XL) 25 MG 24 hr tablet TAKE ONE-HALF TABLET DAILY 15 tablet 3  . polyethylene glycol (MIRALAX / GLYCOLAX) packet Take 17 g by mouth at bedtime.     . sertraline (ZOLOFT) 25 MG tablet TAKE ONE-HALF TABLET DAILY 15 tablet 5  . sitaGLIPtin (JANUVIA) 50 MG tablet Take 50 mg by mouth daily.     No current facility-administered medications on file prior to visit.    No Known Allergies  Family History  Problem Relation Age of Onset  . Hypertension Mother   . Prostate cancer Father   . Diabetes Mother   . Diabetes Maternal Grandfather   . Tuberculosis Paternal Uncle   . Diabetes Sister    x3  . Stroke Sister     x2  . Arthritis Sister     x2  . Breast cancer Sister     x2  . Hypertension Sister     x5    Social History   Social History  . Marital Status: Single    Spouse Name: N/A  . Number of Children: 0  . Years of Education: N/A   Occupational History  . retired Marine scientist    Social History Main Topics  . Smoking status: Never Smoker   . Smokeless tobacco: Never Used  . Alcohol Use: None  . Drug Use: None  . Sexual Activity: Not Asked   Other Topics Concern  . None   Social History Narrative   Review of Systems - See HPI.  All other ROS are negative.  BP 100/58 mmHg  Pulse 92  Temp(Src) 97.8 F (36.6 C) (Oral)  Ht 4' 11"  (1.499 m)  Wt 107 lb 3.2 oz (48.626 kg)  BMI 21.64 kg/m2  SpO2 99%  Physical Exam  Constitutional: She is well-developed, well-nourished, and in no distress.  HENT:  Head: Normocephalic and atraumatic.  Right Ear: External ear normal.  Left Ear: External ear normal.  Nose: Nose normal.  Mouth/Throat: Oropharynx is clear and moist. No oropharyngeal exudate.  Eyes: Conjunctivae are normal.  Neck: Neck supple.  Cardiovascular: Normal rate, regular rhythm, normal heart sounds and intact distal pulses.   Pulmonary/Chest: Effort normal and breath sounds normal. No respiratory distress. She has no wheezes. She has  no rales. She exhibits no tenderness.  Abdominal: Soft. Bowel sounds are normal. She exhibits no distension. There is no tenderness.  Neurological: She has normal strength and intact cranial nerves.  Alert and oriented to person but not place or time, consistent with her baseline dementia.   Skin: Skin is warm and dry. No rash noted.  Psychiatric: Her mood appears not anxious. She does not exhibit a depressed mood. She exhibits ordered thought content. She has a flat affect.  Vitals reviewed.   Recent Results (from the past 2160 hour(s))  Cervicovaginal ancillary only     Status: Abnormal   Collection Time: 07/30/15  12:00 AM  Result Value Ref Range   Wet Prep (BD Affirm) **POSITIVE for Candida** (A)     Comment: Normal Reference Range - Negative  POCT urinalysis dipstick     Status: Abnormal   Collection Time: 07/30/15 11:21 AM  Result Value Ref Range   Color, UA yellow    Clarity, UA hazy    Glucose, UA 3+    Bilirubin, UA negative    Ketones, UA negative    Spec Grav, UA 1.015    Blood, UA trace    pH, UA 6.5    Protein, UA negative    Urobilinogen, UA 0.2    Nitrite, UA negative    Leukocytes, UA Trace (A) Negative  Urine culture     Status: None   Collection Time: 07/30/15 12:11 PM  Result Value Ref Range   Colony Count >=100,000 COLONIES/ML    Organism ID, Bacteria Multiple bacterial morphotypes present, none    Organism ID, Bacteria predominant. Suggest appropriate recollection if     Organism ID, Bacteria clinically indicated.   POCT urinalysis dipstick     Status: Abnormal   Collection Time: 09/10/15 12:26 PM  Result Value Ref Range   Color, UA yellow yellow   Clarity, UA cloudy (A) clear   Glucose, UA >=1,000 (A) negative   Bilirubin, UA negative negative   Ketones, POC UA small (15) (A) negative   Spec Grav, UA 1.020    Blood, UA negative negative   pH, UA 7.0    Protein Ur, POC =30 (A) negative   Urobilinogen, UA 1.0    Nitrite, UA Negative Negative   Leukocytes, UA small (1+) (A) Negative  POCT Microscopic Urinalysis ([facility])     Status: Abnormal   Collection Time: 09/10/15 12:26 PM  Result Value Ref Range   WBC,UR,HPF,POC Moderate (A) None WBC/hpf   RBC,UR,HPF,POC Few (A) None RBC/hpf   Bacteria Few (A) None, Too numerous to count   Mucus Absent Absent   Epithelial Cells, UR Per Microscopy Few (A) None, Too numerous to count cells/hpf  Urine culture     Status: None   Collection Time: 09/10/15  1:18 PM  Result Value Ref Range   Colony Count >=100,000 COLONIES/ML    Organism ID, Bacteria LACTOBACILLUS SPECIES     Comment: Standardized susceptibility testing for  this organism is not available.   CBC w/Diff     Status: Abnormal   Collection Time: 10/23/15 12:03 PM  Result Value Ref Range   WBC 7.9 4.0 - 10.5 K/uL   RBC 4.18 3.87 - 5.11 Mil/uL   Hemoglobin 11.7 (L) 12.0 - 15.0 g/dL   HCT 36.8 36.0 - 46.0 %   MCV 88.1 78.0 - 100.0 fl   MCHC 31.7 30.0 - 36.0 g/dL   RDW 13.9 11.5 - 15.5 %   Platelets 226.0 150.0 - 400.0  K/uL   Neutrophils Relative % 76.8 43.0 - 77.0 %   Lymphocytes Relative 11.5 (L) 12.0 - 46.0 %   Monocytes Relative 11.2 3.0 - 12.0 %   Eosinophils Relative 0.3 0.0 - 5.0 %   Basophils Relative 0.2 0.0 - 3.0 %   Neutro Abs 6.1 1.4 - 7.7 K/uL   Lymphs Abs 0.9 0.7 - 4.0 K/uL   Monocytes Absolute 0.9 0.1 - 1.0 K/uL   Eosinophils Absolute 0.0 0.0 - 0.7 K/uL   Basophils Absolute 0.0 0.0 - 0.1 K/uL  Hemoglobin A1c     Status: Abnormal   Collection Time: 10/23/15 12:03 PM  Result Value Ref Range   Hgb A1c MFr Bld 12.4 (H) 4.6 - 6.5 %    Comment: Glycemic Control Guidelines for People with Diabetes:Non Diabetic:  <6%Goal of Therapy: <7%Additional Action Suggested:  >8%   Comp Met (CMET)     Status: Abnormal   Collection Time: 10/23/15 12:03 PM  Result Value Ref Range   Sodium 131 (L) 135 - 145 mEq/L   Potassium 4.2 3.5 - 5.1 mEq/L   Chloride 94 (L) 96 - 112 mEq/L   CO2 28 19 - 32 mEq/L   Glucose, Bld 381 (H) 70 - 99 mg/dL   BUN 21 6 - 23 mg/dL   Creatinine, Ser 0.72 0.40 - 1.20 mg/dL   Total Bilirubin 0.7 0.2 - 1.2 mg/dL   Alkaline Phosphatase 186 (H) 39 - 117 U/L   AST 25 0 - 37 U/L   ALT 37 (H) 0 - 35 U/L   Total Protein 7.0 6.0 - 8.3 g/dL   Albumin 3.6 3.5 - 5.2 g/dL   Calcium 9.7 8.4 - 10.5 mg/dL   GFR 98.13 >60.00 mL/min  POC Glucose (CBG)     Status: Abnormal   Collection Time: 10/26/15 11:48 AM  Result Value Ref Range   POC Glucose 412 (A) 70 - 99 mg/dl  Comp Met (CMET)     Status: Abnormal   Collection Time: 10/26/15 12:20 PM  Result Value Ref Range   Sodium 131 (L) 135 - 145 mEq/L   Potassium 4.3 3.5 - 5.1  mEq/L   Chloride 96 96 - 112 mEq/L   CO2 29 19 - 32 mEq/L   Glucose, Bld 408 (H) 70 - 99 mg/dL   BUN 19 6 - 23 mg/dL   Creatinine, Ser 0.62 0.40 - 1.20 mg/dL   Total Bilirubin 0.6 0.2 - 1.2 mg/dL   Alkaline Phosphatase 198 (H) 39 - 117 U/L   AST 25 0 - 37 U/L   ALT 30 0 - 35 U/L   Total Protein 6.7 6.0 - 8.3 g/dL   Albumin 3.6 3.5 - 5.2 g/dL   Calcium 9.6 8.4 - 10.5 mg/dL   GFR 116.60 >60.00 mL/min  Gamma GT     Status: Abnormal   Collection Time: 10/26/15 12:20 PM  Result Value Ref Range   GGT 159 (H) 7 - 51 U/L  Urinalysis, Routine w reflex microscopic (not at [facility])     Status: Abnormal   Collection Time: 10/27/15  9:30 AM  Result Value Ref Range   Color, Urine YELLOW YELLOW   APPearance CLOUDY (A) CLEAR   Specific Gravity, Urine 1.041 (H) 1.005 - 1.030   pH 7.0 5.0 - 8.0   Glucose, UA >1000 (A) NEGATIVE mg/dL   Hgb urine dipstick NEGATIVE NEGATIVE   Bilirubin Urine NEGATIVE NEGATIVE   Ketones, ur NEGATIVE NEGATIVE mg/dL   Protein, ur NEGATIVE NEGATIVE  mg/dL   Nitrite NEGATIVE NEGATIVE   Leukocytes, UA MODERATE (A) NEGATIVE  Urine microscopic-add on     Status: Abnormal   Collection Time: 10/27/15  9:30 AM  Result Value Ref Range   Squamous Epithelial / LPF 6-30 (A) NONE SEEN   WBC, UA 6-30 0 - 5 WBC/hpf   RBC / HPF 0-5 0 - 5 RBC/hpf   Bacteria, UA MANY (A) NONE SEEN   Urine-Other MUCOUS PRESENT     Comment: YEAST PRESENT AMORPHOUS URATES/PHOSPHATES   Basic metabolic panel     Status: Abnormal   Collection Time: 10/27/15 10:02 AM  Result Value Ref Range   Sodium 134 (L) 135 - 145 mmol/L   Potassium 5.0 3.5 - 5.1 mmol/L   Chloride 100 (L) 101 - 111 mmol/L   CO2 26 22 - 32 mmol/L   Glucose, Bld 396 (H) 65 - 99 mg/dL   BUN 18 6 - 20 mg/dL   Creatinine, Ser 0.63 0.44 - 1.00 mg/dL   Calcium 9.1 8.9 - 10.3 mg/dL   GFR calc non Af Amer >60 >60 mL/min   GFR calc Af Amer >60 >60 mL/min    Comment: (NOTE) The eGFR has been calculated using the CKD EPI equation. This  calculation has not been validated in all clinical situations. eGFR's persistently <60 mL/min signify possible Chronic Kidney Disease.    Anion gap 8 5 - 15  CBC     Status: Abnormal   Collection Time: 10/27/15 10:02 AM  Result Value Ref Range   WBC 7.5 4.0 - 10.5 K/uL   RBC 4.00 3.87 - 5.11 MIL/uL   Hemoglobin 11.4 (L) 12.0 - 15.0 g/dL   HCT 35.8 (L) 36.0 - 46.0 %   MCV 89.5 78.0 - 100.0 fL   MCH 28.5 26.0 - 34.0 pg   MCHC 31.8 30.0 - 36.0 g/dL   RDW 13.5 11.5 - 15.5 %   Platelets 193 150 - 400 K/uL  CBG monitoring, ED     Status: Abnormal   Collection Time: 10/27/15 10:53 AM  Result Value Ref Range   Glucose-Capillary 344 (H) 65 - 99 mg/dL   Comment 1 Notify RN    Comment 2 Document in Chart   POC CBG, ED     Status: Abnormal   Collection Time: 10/27/15 12:53 PM  Result Value Ref Range   Glucose-Capillary 149 (H) 65 - 99 mg/dL    Assessment/Plan: Diabetes (HCC) Will repeat BMP and A1C today as is overdue. Will also obtain UA and culture today. Continue Januvia as directed. Will alter regimen based on results.   Dementia, senile with depression Patient alert and oriented to person. Will increase Aricept. Will obtain lab panel today to assess further. Reviewed appropriate intake with patient and her niece. Discussed possible need for more involved care than assisted living.   Essential hypertension, benign BP low end of normal. Orthostatics relative unremarkable with only 10 point SBP drop but with baseline low BP this could be causing her symptoms. Will decrease Losartan to 50 mg daily.

## 2015-10-23 NOTE — Telephone Encounter (Signed)
-----  Message from Brunetta Jeans, PA-C sent at 10/23/2015  4:46 PM EDT ----- For now in regards to diabetes, we need to add-on glimiperide 1 mg daily. Until follow-up ----- Message -----    From: Brunetta Jeans, PA-C    Sent: 10/23/2015   4:45 PM      To: [name], CMA  Sugars levels are high at 12.4. We need to discuss new medication regimen, potentially insulin and referral to Endocrinology. Her blood count looks good overall. Sodium level is decreased so she needs to increase salt intake and maybe add on electrolyte water daily. Her ALK phos level was high which can be related to biliary tract or bones. Would like to see her early next week for re-evaluation and in-depth discussion of results giving her advanced age and multiple health issues.  She definitely would benefit from the home aide to make sure she is eating and drinking.

## 2015-10-23 NOTE — Patient Instructions (Signed)
Please go to the lab for blood work. I will call with results.  Please take the new medication list to [facility]. I am increasing Aricpet to 10 mg daily. I am decreasing Losartan to 50 mg daily. New prescription shave been sent in.  I will be setting up a home aide to come help out with things. Give some more thought to more skilled care.  Follow-up with me in 2 weeks.

## 2015-10-26 ENCOUNTER — Ambulatory Visit (INDEPENDENT_AMBULATORY_CARE_PROVIDER_SITE_OTHER): Payer: Medicare Other | Admitting: Physician Assistant

## 2015-10-26 ENCOUNTER — Encounter: Payer: Self-pay | Admitting: Physician Assistant

## 2015-10-26 ENCOUNTER — Telehealth: Payer: Self-pay | Admitting: *Deleted

## 2015-10-26 VITALS — BP 118/74 | HR 82 | Temp 98.0°F | Ht 59.0 in | Wt 110.0 lb

## 2015-10-26 DIAGNOSIS — IMO0002 Reserved for concepts with insufficient information to code with codable children: Secondary | ICD-10-CM

## 2015-10-26 DIAGNOSIS — E1365 Other specified diabetes mellitus with hyperglycemia: Secondary | ICD-10-CM

## 2015-10-26 DIAGNOSIS — F0393 Unspecified dementia, unspecified severity, with mood disturbance: Secondary | ICD-10-CM

## 2015-10-26 DIAGNOSIS — E871 Hypo-osmolality and hyponatremia: Secondary | ICD-10-CM | POA: Diagnosis not present

## 2015-10-26 DIAGNOSIS — I10 Essential (primary) hypertension: Secondary | ICD-10-CM | POA: Diagnosis not present

## 2015-10-26 DIAGNOSIS — R748 Abnormal levels of other serum enzymes: Secondary | ICD-10-CM | POA: Diagnosis not present

## 2015-10-26 DIAGNOSIS — F0390 Unspecified dementia without behavioral disturbance: Secondary | ICD-10-CM

## 2015-10-26 DIAGNOSIS — E1142 Type 2 diabetes mellitus with diabetic polyneuropathy: Secondary | ICD-10-CM

## 2015-10-26 LAB — GLUCOSE, POCT (MANUAL RESULT ENTRY): POC GLUCOSE: 412 mg/dL — AB (ref 70–99)

## 2015-10-26 LAB — COMPREHENSIVE METABOLIC PANEL
ALK PHOS: 198 U/L — AB (ref 39–117)
ALT: 30 U/L (ref 0–35)
AST: 25 U/L (ref 0–37)
Albumin: 3.6 g/dL (ref 3.5–5.2)
BILIRUBIN TOTAL: 0.6 mg/dL (ref 0.2–1.2)
BUN: 19 mg/dL (ref 6–23)
CALCIUM: 9.6 mg/dL (ref 8.4–10.5)
CO2: 29 meq/L (ref 19–32)
CREATININE: 0.62 mg/dL (ref 0.40–1.20)
Chloride: 96 mEq/L (ref 96–112)
GFR: 116.6 mL/min (ref >60.00)
Glucose, Bld: 408 mg/dL — ABNORMAL HIGH (ref 70–99)
Potassium: 4.3 mEq/L (ref 3.5–5.1)
Sodium: 131 mEq/L — ABNORMAL LOW (ref 135–145)
TOTAL PROTEIN: 6.7 g/dL (ref 6.0–8.3)

## 2015-10-26 LAB — GAMMA GT: GGT: 159 U/L — ABNORMAL HIGH (ref 7–51)

## 2015-10-26 MED ORDER — ONETOUCH ULTRASOFT LANCETS MISC: Status: DC

## 2015-10-26 MED ORDER — LOSARTAN POTASSIUM 50 MG PO TABS: 25.0000 mg | ORAL_TABLET | Freq: Every day | ORAL | Status: DC

## 2015-10-26 MED ORDER — BASAGLAR KWIKPEN 100 UNIT/ML ~~LOC~~ SOPN: 8.0000 [IU] | PEN_INJECTOR | Freq: Every day | SUBCUTANEOUS | Status: DC

## 2015-10-26 NOTE — Progress Notes (Signed)
Patient presents to clinic today for 3 day follow-up of low blood pressure and dementia.   Patient is taking the increased dose of Aricept daily and tolerating well. Is working on diet, increasing her intake.   Has decreased losartan to 50 mg. Endorses still having some lightheadedness but much less frequently. Patient denies chest pain, palpitations, lightheadedness, dizziness, vision changes or frequent headaches. No falls since last visit.  Of note A1C obtained Friday, at 12.4. Patient had Amaryl 1 mg added to Januvia until she could be seen in office as she lives in assisted living and would need training on insulin use before being given to take daily. Denies vision changes. Does note mild urinary frequency. Was unable to give urine specimen at last visit.  Past Medical History  Diagnosis Date  . Diabetes ([place])   . GERD (gastroesophageal reflux disease)   . Idiopathic small intestinal ulcers   . Seasonal allergies   . Hypertension   . Hiatal hernia   . Degenerative arthritis of knee, bilateral     Current Outpatient Prescriptions on File Prior to Visit  Medication Sig Dispense Refill  . acetaminophen (TYLENOL) 500 MG tablet Take 500 mg by mouth 2 (two) times daily.  60 tablet 0  . Calcium Carbonate-Vitamin D (CALTRATE 600+D) 600-400 MG-UNIT tablet Take 1 tablet by mouth daily.    Marland Kitchen donepezil (ARICEPT) 10 MG tablet Take 1 tablet (10 mg total) by mouth at bedtime. 30 tablet 3  . fluticasone (FLONASE) 50 MCG/ACT nasal spray Place 1 spray into both nostrils at bedtime.     . metoprolol succinate (TOPROL-XL) 25 MG 24 hr tablet TAKE ONE-HALF TABLET DAILY 15 tablet 3  . polyethylene glycol (MIRALAX / GLYCOLAX) packet Take 17 g by mouth at bedtime.     . ranitidine (ZANTAC) 150 MG tablet Take 1 tablet (150 mg total) by mouth 2 (two) times daily. Reported on 09/10/2015 60 tablet 5  . sertraline (ZOLOFT) 25 MG tablet TAKE ONE-HALF TABLET DAILY 15 tablet 5  . sitaGLIPtin (JANUVIA) 50 MG  tablet Take 50 mg by mouth daily.     No current facility-administered medications on file prior to visit.    No Known Allergies  Family History  Problem Relation Age of Onset  . Hypertension Mother   . Prostate cancer Father   . Diabetes Mother   . Diabetes Maternal Grandfather   . Tuberculosis Paternal Uncle   . Diabetes Sister     x3  . Stroke Sister     x2  . Arthritis Sister     x2  . Breast cancer Sister     x2  . Hypertension Sister     x5    Social History   Social History  . Marital Status: Single    Spouse Name: N/A  . Number of Children: 0  . Years of Education: N/A   Occupational History  . retired Marine scientist    Social History Main Topics  . Smoking status: Never Smoker   . Smokeless tobacco: Never Used  . Alcohol Use: None  . Drug Use: None  . Sexual Activity: Not Asked   Other Topics Concern  . None   Social History Narrative   Review of Systems - See HPI.  All other ROS are negative.  BP 118/74 mmHg  Pulse 82  Temp(Src) 98 F (36.7 C) (Oral)  Ht 4' 11"  (1.499 m)  Wt 110 lb (49.896 kg)  BMI 22.21 kg/m2  SpO2 98%  Physical  Exam  Constitutional: She is well-developed, well-nourished, and in no distress.  HENT:  Head: Normocephalic and atraumatic.  Right Ear: External ear normal.  Left Ear: External ear normal.  Nose: Nose normal.  Mouth/Throat: Oropharynx is clear and moist.  Eyes: Conjunctivae are normal.  Neck: Neck supple.  Cardiovascular: Normal rate, regular rhythm, normal heart sounds and intact distal pulses.   Pulmonary/Chest: Effort normal and breath sounds normal. No respiratory distress. She has no wheezes. She has no rales. She exhibits no tenderness.  Abdominal: Soft. Bowel sounds are normal. She exhibits no distension. There is no tenderness.  Lymphadenopathy:    She has no cervical adenopathy.  Neurological: She is alert. No cranial nerve deficit. Coordination normal.  Skin: Skin is warm and dry. No rash noted.    Psychiatric: Affect normal.  Vitals reviewed.   Recent Results (from the past 2160 hour(s))  Cervicovaginal ancillary only     Status: Abnormal   Collection Time: 07/30/15 12:00 AM  Result Value Ref Range   Wet Prep (BD Affirm) **POSITIVE for Candida** (A)     Comment: Normal Reference Range - Negative  POCT urinalysis dipstick     Status: Abnormal   Collection Time: 07/30/15 11:21 AM  Result Value Ref Range   Color, UA yellow    Clarity, UA hazy    Glucose, UA 3+    Bilirubin, UA negative    Ketones, UA negative    Spec Grav, UA 1.015    Blood, UA trace    pH, UA 6.5    Protein, UA negative    Urobilinogen, UA 0.2    Nitrite, UA negative    Leukocytes, UA Trace (A) Negative  Urine culture     Status: None   Collection Time: 07/30/15 12:11 PM  Result Value Ref Range   Colony Count >=100,000 COLONIES/ML    Organism ID, Bacteria Multiple bacterial morphotypes present, none    Organism ID, Bacteria predominant. Suggest appropriate recollection if     Organism ID, Bacteria clinically indicated.   POCT urinalysis dipstick     Status: Abnormal   Collection Time: 09/10/15 12:26 PM  Result Value Ref Range   Color, UA yellow yellow   Clarity, UA cloudy (A) clear   Glucose, UA >=1,000 (A) negative   Bilirubin, UA negative negative   Ketones, POC UA small (15) (A) negative   Spec Grav, UA 1.020    Blood, UA negative negative   pH, UA 7.0    Protein Ur, POC =30 (A) negative   Urobilinogen, UA 1.0    Nitrite, UA Negative Negative   Leukocytes, UA small (1+) (A) Negative  POCT Microscopic Urinalysis ([facility])     Status: Abnormal   Collection Time: 09/10/15 12:26 PM  Result Value Ref Range   WBC,UR,HPF,POC Moderate (A) None WBC/hpf   RBC,UR,HPF,POC Few (A) None RBC/hpf   Bacteria Few (A) None, Too numerous to count   Mucus Absent Absent   Epithelial Cells, UR Per Microscopy Few (A) None, Too numerous to count cells/hpf  Urine culture     Status: None   Collection Time:  09/10/15  1:18 PM  Result Value Ref Range   Colony Count >=100,000 COLONIES/ML    Organism ID, Bacteria LACTOBACILLUS SPECIES     Comment: Standardized susceptibility testing for this organism is not available.   CBC w/Diff     Status: Abnormal   Collection Time: 10/23/15 12:03 PM  Result Value Ref Range   WBC 7.9 4.0 - 10.5 K/uL  RBC 4.18 3.87 - 5.11 Mil/uL   Hemoglobin 11.7 (L) 12.0 - 15.0 g/dL   HCT 36.8 36.0 - 46.0 %   MCV 88.1 78.0 - 100.0 fl   MCHC 31.7 30.0 - 36.0 g/dL   RDW 13.9 11.5 - 15.5 %   Platelets 226.0 150.0 - 400.0 K/uL   Neutrophils Relative % 76.8 43.0 - 77.0 %   Lymphocytes Relative 11.5 (L) 12.0 - 46.0 %   Monocytes Relative 11.2 3.0 - 12.0 %   Eosinophils Relative 0.3 0.0 - 5.0 %   Basophils Relative 0.2 0.0 - 3.0 %   Neutro Abs 6.1 1.4 - 7.7 K/uL   Lymphs Abs 0.9 0.7 - 4.0 K/uL   Monocytes Absolute 0.9 0.1 - 1.0 K/uL   Eosinophils Absolute 0.0 0.0 - 0.7 K/uL   Basophils Absolute 0.0 0.0 - 0.1 K/uL  Hemoglobin A1c     Status: Abnormal   Collection Time: 10/23/15 12:03 PM  Result Value Ref Range   Hgb A1c MFr Bld 12.4 (H) 4.6 - 6.5 %    Comment: Glycemic Control Guidelines for People with Diabetes:Non Diabetic:  <6%Goal of Therapy: <7%Additional Action Suggested:  >8%   Comp Met (CMET)     Status: Abnormal   Collection Time: 10/23/15 12:03 PM  Result Value Ref Range   Sodium 131 (L) 135 - 145 mEq/L   Potassium 4.2 3.5 - 5.1 mEq/L   Chloride 94 (L) 96 - 112 mEq/L   CO2 28 19 - 32 mEq/L   Glucose, Bld 381 (H) 70 - 99 mg/dL   BUN 21 6 - 23 mg/dL   Creatinine, Ser 0.72 0.40 - 1.20 mg/dL   Total Bilirubin 0.7 0.2 - 1.2 mg/dL   Alkaline Phosphatase 186 (H) 39 - 117 U/L   AST 25 0 - 37 U/L   ALT 37 (H) 0 - 35 U/L   Total Protein 7.0 6.0 - 8.3 g/dL   Albumin 3.6 3.5 - 5.2 g/dL   Calcium 9.7 8.4 - 10.5 mg/dL   GFR 98.13 >60.00 mL/min  POC Glucose (CBG)     Status: Abnormal   Collection Time: 10/26/15 11:48 AM  Result Value Ref Range   POC Glucose 412  (A) 70 - 99 mg/dl  Comp Met (CMET)     Status: Abnormal   Collection Time: 10/26/15 12:20 PM  Result Value Ref Range   Sodium 131 (L) 135 - 145 mEq/L   Potassium 4.3 3.5 - 5.1 mEq/L   Chloride 96 96 - 112 mEq/L   CO2 29 19 - 32 mEq/L   Glucose, Bld 408 (H) 70 - 99 mg/dL   BUN 19 6 - 23 mg/dL   Creatinine, Ser 0.62 0.40 - 1.20 mg/dL   Total Bilirubin 0.6 0.2 - 1.2 mg/dL   Alkaline Phosphatase 198 (H) 39 - 117 U/L   AST 25 0 - 37 U/L   ALT 30 0 - 35 U/L   Total Protein 6.7 6.0 - 8.3 g/dL   Albumin 3.6 3.5 - 5.2 g/dL   Calcium 9.6 8.4 - 10.5 mg/dL   GFR 116.60 >60.00 mL/min  Gamma GT     Status: Abnormal   Collection Time: 10/26/15 12:20 PM  Result Value Ref Range   GGT 159 (H) 7 - 51 U/L  Urinalysis, Routine w reflex microscopic (not at [facility])     Status: Abnormal   Collection Time: 10/27/15  9:30 AM  Result Value Ref Range   Color, Urine YELLOW YELLOW   APPearance  CLOUDY (A) CLEAR   Specific Gravity, Urine 1.041 (H) 1.005 - 1.030   pH 7.0 5.0 - 8.0   Glucose, UA >1000 (A) NEGATIVE mg/dL   Hgb urine dipstick NEGATIVE NEGATIVE   Bilirubin Urine NEGATIVE NEGATIVE   Ketones, ur NEGATIVE NEGATIVE mg/dL   Protein, ur NEGATIVE NEGATIVE mg/dL   Nitrite NEGATIVE NEGATIVE   Leukocytes, UA MODERATE (A) NEGATIVE  Urine microscopic-add on     Status: Abnormal   Collection Time: 10/27/15  9:30 AM  Result Value Ref Range   Squamous Epithelial / LPF 6-30 (A) NONE SEEN   WBC, UA 6-30 0 - 5 WBC/hpf   RBC / HPF 0-5 0 - 5 RBC/hpf   Bacteria, UA MANY (A) NONE SEEN   Urine-Other MUCOUS PRESENT     Comment: YEAST PRESENT AMORPHOUS URATES/PHOSPHATES   Basic metabolic panel     Status: Abnormal   Collection Time: 10/27/15 10:02 AM  Result Value Ref Range   Sodium 134 (L) 135 - 145 mmol/L   Potassium 5.0 3.5 - 5.1 mmol/L   Chloride 100 (L) 101 - 111 mmol/L   CO2 26 22 - 32 mmol/L   Glucose, Bld 396 (H) 65 - 99 mg/dL   BUN 18 6 - 20 mg/dL   Creatinine, Ser 0.63 0.44 - 1.00 mg/dL    Calcium 9.1 8.9 - 10.3 mg/dL   GFR calc non Af Amer >60 >60 mL/min   GFR calc Af Amer >60 >60 mL/min    Comment: (NOTE) The eGFR has been calculated using the CKD EPI equation. This calculation has not been validated in all clinical situations. eGFR's persistently <60 mL/min signify possible Chronic Kidney Disease.    Anion gap 8 5 - 15  CBC     Status: Abnormal   Collection Time: 10/27/15 10:02 AM  Result Value Ref Range   WBC 7.5 4.0 - 10.5 K/uL   RBC 4.00 3.87 - 5.11 MIL/uL   Hemoglobin 11.4 (L) 12.0 - 15.0 g/dL   HCT 35.8 (L) 36.0 - 46.0 %   MCV 89.5 78.0 - 100.0 fL   MCH 28.5 26.0 - 34.0 pg   MCHC 31.8 30.0 - 36.0 g/dL   RDW 13.5 11.5 - 15.5 %   Platelets 193 150 - 400 K/uL  CBG monitoring, ED     Status: Abnormal   Collection Time: 10/27/15 10:53 AM  Result Value Ref Range   Glucose-Capillary 344 (H) 65 - 99 mg/dL   Comment 1 Notify RN    Comment 2 Document in Chart   POC CBG, ED     Status: Abnormal   Collection Time: 10/27/15 12:53 PM  Result Value Ref Range   Glucose-Capillary 149 (H) 65 - 99 mg/dL    Assessment/Plan: Essential hypertension, benign BP mildly improved from last check but still too low for comfort giving prior symptoms. Still with symptoms of lightheadedness. Will decrease losartan to 25 mg daily. Follow-up scheduled.  Diabetes ([place]) Will continue Januvia as she is on max dose for her creatine clearance. Unclear why the significant change, especially giving she was previously only eating about once per day. Will start insulin glargine 8 units daily with first dose given in office. Continue Januvia. Will attempt to obtain UA again today. Order sent to [facility] [facility] for nurse to administer insulin daily and check sugars BID (they are to call with results). Will follow-up again this Friday for reassessment as long as things are improving.  Dementia, senile with depression Tolerating 10 mg Aricept.  Will continue.  Elevated alkaline phosphatase  level Noted on blood work. Will repeat LFT and add-on a GGT to help narrow down etiology of elevated alk phos.

## 2015-10-26 NOTE — Telephone Encounter (Signed)
Received physician order; forwarded to provider/SLS 03/27

## 2015-10-26 NOTE — Patient Instructions (Addendum)
Please go to the lab for blood work. I will call with repeat results.  We are stopping the Glimepiride. Continue the Januvia as directed. We are starting the Basaglar at 8 units once daily to help with sugars. Take at the same time every day (noon), until follow-up on Friday. Please check sugars twice daily -- once fasting and once in the evening. Write these numbers down. We will review at follow-up. Our goal for right now is to get sugars < 200. We will get tighter control once this goal is reached.   Please continue working on getting Mrs. Forsee into more skilled care at [facility]. Let me know if you have any issue and I can aid in this.   Follow-up with me on Friday of this week.

## 2015-10-26 NOTE — Telephone Encounter (Signed)
Received Physician Order Clarification fax; forwarded to provider/SLS 03/27

## 2015-10-26 NOTE — Progress Notes (Signed)
Pre visit review using our clinic review tool, if applicable. No additional management support is needed unless otherwise documented below in the visit note. 

## 2015-10-27 ENCOUNTER — Encounter (HOSPITAL_COMMUNITY): Payer: Self-pay | Admitting: *Deleted

## 2015-10-27 ENCOUNTER — Emergency Department (HOSPITAL_COMMUNITY)
Admission: EM | Admit: 2015-10-27 | Discharge: 2015-10-27 | Disposition: A | Discharge status: Home / Self Care | Payer: Medicare Other | Attending: Emergency Medicine | Admitting: Emergency Medicine

## 2015-10-27 ENCOUNTER — Encounter: Payer: Self-pay | Admitting: Physician Assistant

## 2015-10-27 DIAGNOSIS — I10 Essential (primary) hypertension: Secondary | ICD-10-CM | POA: Diagnosis not present

## 2015-10-27 DIAGNOSIS — Z7982 Long term (current) use of aspirin: Secondary | ICD-10-CM | POA: Diagnosis not present

## 2015-10-27 DIAGNOSIS — K219 Gastro-esophageal reflux disease without esophagitis: Secondary | ICD-10-CM | POA: Diagnosis not present

## 2015-10-27 DIAGNOSIS — N390 Urinary tract infection, site not specified: Secondary | ICD-10-CM | POA: Insufficient documentation

## 2015-10-27 DIAGNOSIS — Z7952 Long term (current) use of systemic steroids: Secondary | ICD-10-CM | POA: Diagnosis not present

## 2015-10-27 DIAGNOSIS — Z79899 Other long term (current) drug therapy: Secondary | ICD-10-CM | POA: Insufficient documentation

## 2015-10-27 DIAGNOSIS — Z794 Long term (current) use of insulin: Secondary | ICD-10-CM | POA: Diagnosis not present

## 2015-10-27 DIAGNOSIS — E1165 Type 2 diabetes mellitus with hyperglycemia: Secondary | ICD-10-CM | POA: Insufficient documentation

## 2015-10-27 DIAGNOSIS — R739 Hyperglycemia, unspecified: Secondary | ICD-10-CM

## 2015-10-27 DIAGNOSIS — Z7984 Long term (current) use of oral hypoglycemic drugs: Secondary | ICD-10-CM | POA: Insufficient documentation

## 2015-10-27 DIAGNOSIS — R748 Abnormal levels of other serum enzymes: Secondary | ICD-10-CM | POA: Insufficient documentation

## 2015-10-27 DIAGNOSIS — M170 Bilateral primary osteoarthritis of knee: Secondary | ICD-10-CM | POA: Diagnosis not present

## 2015-10-27 LAB — BASIC METABOLIC PANEL
ANION GAP: 8 (ref 5–15)
BUN: 18 mg/dL (ref 6–20)
CHLORIDE: 100 mmol/L — AB (ref 101–111)
CO2: 26 mmol/L (ref 22–32)
Calcium: 9.1 mg/dL (ref 8.9–10.3)
Creatinine, Ser: 0.63 mg/dL (ref 0.44–1.00)
GFR calc Af Amer: >60 mL/min (ref >60)
GFR calc non Af Amer: >60 mL/min (ref >60)
Glucose, Bld: 396 mg/dL — ABNORMAL HIGH (ref 65–99)
POTASSIUM: 5 mmol/L (ref 3.5–5.1)
SODIUM: 134 mmol/L — AB (ref 135–145)

## 2015-10-27 LAB — CBC
HEMATOCRIT: 35.8 % — AB (ref 36.0–46.0)
HEMOGLOBIN: 11.4 g/dL — AB (ref 12.0–15.0)
MCH: 28.5 pg (ref 26.0–34.0)
MCHC: 31.8 g/dL (ref 30.0–36.0)
MCV: 89.5 fL (ref 78.0–100.0)
Platelets: 193 10*3/uL (ref 150–400)
RBC: 4 MIL/uL (ref 3.87–5.11)
RDW: 13.5 % (ref 11.5–15.5)
WBC: 7.5 10*3/uL (ref 4.0–10.5)

## 2015-10-27 LAB — URINALYSIS, ROUTINE W REFLEX MICROSCOPIC
Bilirubin Urine: NEGATIVE
Hgb urine dipstick: NEGATIVE
Ketones, ur: NEGATIVE mg/dL
Nitrite: NEGATIVE
PH: 7 (ref 5.0–8.0)
Protein, ur: NEGATIVE mg/dL
SPECIFIC GRAVITY, URINE: 1.041 — AB (ref 1.005–1.030)

## 2015-10-27 LAB — URINE MICROSCOPIC-ADD ON

## 2015-10-27 LAB — CBG MONITORING, ED
Glucose-Capillary: 344 mg/dL — ABNORMAL HIGH (ref 65–99)
Glucose-Capillary: 149 mg/dL — ABNORMAL HIGH (ref 65–99)

## 2015-10-27 MED ORDER — SODIUM CHLORIDE 0.9 % IV BOLUS (SEPSIS)
500.0000 mL | Freq: Once | INTRAVENOUS | Status: AC
  Administered 2015-10-27: 500 mL via INTRAVENOUS

## 2015-10-27 MED ORDER — CEPHALEXIN 500 MG PO CAPS: 500.0000 mg | ORAL_CAPSULE | Freq: Three times a day (TID) | ORAL | Status: DC

## 2015-10-27 MED ORDER — DEXTROSE 5 % IV SOLN
1.0000 g | Freq: Once | INTRAVENOUS | Status: AC
  Administered 2015-10-27: 1 g via INTRAVENOUS
  Filled 2015-10-27: qty 10

## 2015-10-27 MED ORDER — INSULIN ASPART 100 UNIT/ML ~~LOC~~ SOLN
8.0000 [IU] | Freq: Once | SUBCUTANEOUS | Status: AC
  Administered 2015-10-27: 8 [IU] via SUBCUTANEOUS
  Filled 2015-10-27: qty 1

## 2015-10-27 MED ORDER — BASAGLAR KWIKPEN 100 UNIT/ML ~~LOC~~ SOPN
8.0000 [IU] | PEN_INJECTOR | Freq: Once | SUBCUTANEOUS | Status: AC
  Administered 2015-10-26: 8 [IU] via SUBCUTANEOUS

## 2015-10-27 MED ORDER — INSULIN ASPART 100 UNIT/ML ~~LOC~~ SOLN: 8.0000 [IU] | Freq: Once | SUBCUTANEOUS | Status: DC

## 2015-10-27 NOTE — Discharge Instructions (Signed)
Hyperglycemia °Hyperglycemia occurs when the glucose (sugar) in your blood is too high. Hyperglycemia can happen for many reasons, but it most often happens to people who do not know they have diabetes or are not managing their diabetes properly.  °CAUSES  °Whether you have diabetes or not, there are other causes of hyperglycemia. Hyperglycemia can occur when you have diabetes, but it can also occur in other situations that you might not be as aware of, such as: °Diabetes °· If you have diabetes and are having problems controlling your blood glucose, hyperglycemia could occur because of some of the following reasons: °· Not following your meal plan. °· Not taking your diabetes medications or not taking it properly. °· Exercising less or doing less activity than you normally do. °· Being sick. °Pre-diabetes °· This cannot be ignored. Before people develop Type 2 diabetes, they almost always have "pre-diabetes." This is when your blood glucose levels are higher than normal, but not yet high enough to be diagnosed as diabetes. Research has shown that some long-term damage to the body, especially the heart and circulatory system, may already be occurring during pre-diabetes. If you take action to manage your blood glucose when you have pre-diabetes, you may delay or prevent Type 2 diabetes from developing. °Stress °· If you have diabetes, you may be "diet" controlled or on oral medications or insulin to control your diabetes. However, you may find that your blood glucose is higher than usual in the hospital whether you have diabetes or not. This is often referred to as "stress hyperglycemia." Stress can elevate your blood glucose. This happens because of hormones put out by the body during times of stress. If stress has been the cause of your high blood glucose, it can be followed regularly by your caregiver. That way he/she can make sure your hyperglycemia does not continue to get worse or progress to  diabetes. °Steroids °· Steroids are medications that act on the infection fighting system (immune system) to block inflammation or infection. One side effect can be a rise in blood glucose. Most people can produce enough extra insulin to allow for this rise, but for those who cannot, steroids make blood glucose levels go even higher. It is not unusual for steroid treatments to "uncover" diabetes that is developing. It is not always possible to determine if the hyperglycemia will go away after the steroids are stopped. A special blood test called an A1c is sometimes done to determine if your blood glucose was elevated before the steroids were started. °SYMPTOMS °· Thirsty. °· Frequent urination. °· Dry mouth. °· Blurred vision. °· Tired or fatigue. °· Weakness. °· Sleepy. °· Tingling in feet or leg. °DIAGNOSIS  °Diagnosis is made by monitoring blood glucose in one or all of the following ways: °· A1c test. This is a chemical found in your blood. °· Fingerstick blood glucose monitoring. °· Laboratory results. °TREATMENT  °First, knowing the cause of the hyperglycemia is important before the hyperglycemia can be treated. Treatment may include, but is not be limited to: °· Education. °· Change or adjustment in medications. °· Change or adjustment in meal plan. °· Treatment for an illness, infection, etc. °· More frequent blood glucose monitoring. °· Change in exercise plan. °· Decreasing or stopping steroids. °· Lifestyle changes. °HOME CARE INSTRUCTIONS  °· Test your blood glucose as directed. °· Exercise regularly. Your caregiver will give you instructions about exercise. Pre-diabetes or diabetes which comes on with stress is helped by exercising. °· Eat wholesome,   balanced meals. Eat often and at regular, fixed times. Your caregiver or nutritionist will give you a meal plan to guide your sugar intake.  Being at an ideal weight is important. If needed, losing as little as 10 to 15 pounds may help improve blood  glucose levels. SEEK MEDICAL CARE IF:   You have questions about medicine, activity, or diet.  You continue to have symptoms (problems such as increased thirst, urination, or weight gain). SEEK IMMEDIATE MEDICAL CARE IF:   You are vomiting or have diarrhea.  Your breath smells fruity.  You are breathing faster or slower.  You are very sleepy or incoherent.  You have numbness, tingling, or pain in your feet or hands.  You have chest pain.  Your symptoms get worse even though you have been following your caregiver's orders.  If you have any other questions or concerns.   This information is not intended to replace advice given to you by your health care provider. Make sure you discuss any questions you have with your health care provider.   Document Released: 01/11/2001 Document Revised: 10/10/2011 Document Reviewed: 03/24/2015 Elsevier Interactive Patient Education 2016 [employer]  Urinary Tract Infection A urinary tract infection (UTI) can occur any place along the urinary tract. The tract includes the kidneys, ureters, bladder, and urethra. A type of germ called bacteria often causes a UTI. UTIs are often helped with antibiotic medicine.  HOME CARE   If given, take antibiotics as told by your doctor. Finish them even if you start to feel better.  Drink enough fluids to keep your pee (urine) clear or pale yellow.  Avoid tea, drinks with caffeine, and bubbly (carbonated) drinks.  Pee often. Avoid holding your pee in for a long time.  Pee before and after having sex (intercourse).  Wipe from front to back after you poop (bowel movement) if you are a woman. Use each tissue only once. GET HELP RIGHT AWAY IF:   You have back pain.  You have lower belly (abdominal) pain.  You have chills.  You feel sick to your stomach (nauseous).  You throw up (vomit).  Your burning or discomfort with peeing does not go away.  You have a fever.  Your symptoms are not better  in 3 days. MAKE SURE YOU:   Understand these instructions.  Will watch your condition.  Will get help right away if you are not doing well or get worse.   This information is not intended to replace advice given to you by your health care provider. Make sure you discuss any questions you have with your health care provider.   Document Released: 01/04/2008 Document Revised: 08/08/2014 Document Reviewed: 02/16/2012 Elsevier Interactive Patient Education [employer].

## 2015-10-27 NOTE — ED Notes (Signed)
CMP has not yet read yet. POC CBG being collected.

## 2015-10-27 NOTE — Assessment & Plan Note (Signed)
Patient alert and oriented to person. Will increase Aricept. Will obtain lab panel today to assess further. Reviewed appropriate intake with patient and her niece. Discussed possible need for more involved care than assisted living.

## 2015-10-27 NOTE — ED Notes (Signed)
MD at bedside. 

## 2015-10-27 NOTE — Assessment & Plan Note (Signed)
Tolerating 10 mg Aricept. Will continue.

## 2015-10-27 NOTE — Assessment & Plan Note (Signed)
Will repeat BMP and A1C today as is overdue. Will also obtain UA and culture today. Continue Januvia as directed. Will alter regimen based on results.

## 2015-10-27 NOTE — Assessment & Plan Note (Signed)
Will continue Januvia as she is on max dose for her creatine clearance. Unclear why the significant change, especially giving she was previously only eating about once per day. Will start insulin glargine 8 units daily with first dose given in office. Continue Januvia. Will attempt to obtain UA again today. Order sent to [place] [facility] for nurse to administer insulin daily and check sugars BID (they are to call with results). Will follow-up again this Friday for reassessment as long as things are improving.

## 2015-10-27 NOTE — Assessment & Plan Note (Signed)
BP mildly improved from last check but still too low for comfort giving prior symptoms. Still with symptoms of lightheadedness. Will decrease losartan to 25 mg daily. Follow-up scheduled.

## 2015-10-27 NOTE — Assessment & Plan Note (Signed)
Noted on blood work. Will repeat LFT and add-on a GGT to help narrow down etiology of elevated alk phos.

## 2015-10-27 NOTE — Assessment & Plan Note (Signed)
BP low end of normal. Orthostatics relative unremarkable with only 10 point SBP drop but with baseline low BP this could be causing her symptoms. Will decrease Losartan to 50 mg daily.

## 2015-10-27 NOTE — ED Notes (Signed)
Per EMS, pt from [facility], reports her blood sugar normally runs low, had breakfast this am.  CBG was 481.  Staff reports she was not due for another dose of insulin until noon so they called attending MD and sent pt to the ED.  No insulin was administered to correct the elevated blood sugar prior to transport to the ED.  Pt is A&Ox 4.  Denies any pain at this time.

## 2015-10-27 NOTE — ED Notes (Signed)
Pt ambulated to RR with minimal assistance by family member

## 2015-10-27 NOTE — ED Provider Notes (Signed)
CSN: [id number]     Arrival date & time 10/27/15  0906 History   First MD Initiated Contact with Patient 10/27/15 [phone]     Chief Complaint  Patient presents with  . Hyperglycemia     HPI  Patient presents for evaluation secondary to hyperglycemia. Has long history of type 2 diabetes. Previously on Januvia. Had blood sugars last week that were elevated at 23 or 400. Seen by primary care on Friday. Amaryl was added to her medicine regimen in addition to the Januvia. Rechecked yesterday and blood sugar still at 412. Amaryl was discontinued and started on insulin gargline, 8 units once daily. Sugar still high today, thus referred here. Vision has had polyuria. She states that last night she started noticing burning with urination. No fevers or chills. No nausea vomiting. Remains interactive.    Past Medical History  Diagnosis Date  . Diabetes ([place])   . GERD (gastroesophageal reflux disease)   . Idiopathic small intestinal ulcers   . Seasonal allergies   . Hypertension   . Hiatal hernia   . Degenerative arthritis of knee, bilateral    Past Surgical History  Procedure Laterality Date  . Dilation and curettage of uterus      x3  . Knee arthroscopy      left  . Wisdom tooth extraction     Family History  Problem Relation Age of Onset  . Hypertension Mother   . Prostate cancer Father   . Diabetes Mother   . Diabetes Maternal Grandfather   . Tuberculosis Paternal Uncle   . Diabetes Sister     x3  . Stroke Sister     x2  . Arthritis Sister     x2  . Breast cancer Sister     x2  . Hypertension Sister     x5   Social History  Substance Use Topics  . Smoking status: Never Smoker   . Smokeless tobacco: Never Used  . Alcohol Use: None   OB History    No data available     Review of Systems  Constitutional: Negative for fever, chills, diaphoresis, appetite change and fatigue.  HENT: Negative for mouth sores, sore throat and trouble swallowing.   Eyes: Negative for visual  disturbance.  Respiratory: Negative for cough, chest tightness, shortness of breath and wheezing.   Cardiovascular: Negative for chest pain.  Gastrointestinal: Negative for nausea, vomiting, abdominal pain, diarrhea and abdominal distention.  Endocrine: Positive for polyuria. Negative for polydipsia and polyphagia.  Genitourinary: Positive for dysuria and difficulty urinating. Negative for frequency and hematuria.  Musculoskeletal: Negative for gait problem.  Skin: Negative for color change, pallor and rash.  Neurological: Negative for dizziness, syncope, light-headedness and headaches.  Hematological: Does not bruise/bleed easily.  Psychiatric/Behavioral: Negative for behavioral problems and confusion.      Allergies  Review of patient's allergies indicates no known allergies.  Home Medications   Prior to Admission medications   Medication Sig Start Date End Date Taking? Authorizing Provider  acetaminophen (TYLENOL) 500 MG tablet Take 500 mg by mouth 2 (two) times daily.  10/23/15  Yes Brunetta Jeans, PA-C  aspirin 81 MG chewable tablet Chew 81 mg by mouth daily.   Yes Historical Provider, MD  Calcium Carbonate-Vitamin D (CALTRATE 600+D) 600-400 MG-UNIT tablet Take 1 tablet by mouth daily.   Yes Historical Provider, MD  donepezil (ARICEPT) 10 MG tablet Take 1 tablet (10 mg total) by mouth at bedtime. 10/23/15  Yes Brunetta Jeans, PA-C  fluticasone (FLONASE) 50 MCG/ACT nasal spray Place 1 spray into both nostrils at bedtime.    Yes Historical Provider, MD  ibuprofen (ADVIL,MOTRIN) 200 MG tablet Take 400 mg by mouth every 6 (six) hours as needed (for pain).   Yes Historical Provider, MD  Insulin Glargine (BASAGLAR KWIKPEN) 100 UNIT/ML SOPN Inject 0.08 mLs (8 Units total) into the skin daily. 10/26/15  Yes Brunetta Jeans, PA-C  Lancets [facility] ULTRASOFT) lancets Use as instructed. Check sugars twice daily. 10/26/15  Yes Brunetta Jeans, PA-C  losartan (COZAAR) 50 MG tablet Take 0.5  tablets (25 mg total) by mouth daily. 10/26/15  Yes Brunetta Jeans, PA-C  metoprolol succinate (TOPROL-XL) 25 MG 24 hr tablet TAKE ONE-HALF TABLET DAILY 08/31/15  Yes Brunetta Jeans, PA-C  polyethylene glycol (MIRALAX / GLYCOLAX) packet Take 17 g by mouth at bedtime.    Yes Historical Provider, MD  Psyllium 500 MG CAPS Take 500 mg by mouth at bedtime.   Yes Historical Provider, MD  ranitidine (ZANTAC) 150 MG tablet Take 1 tablet (150 mg total) by mouth 2 (two) times daily. Reported on 09/10/2015 10/23/15  Yes Brunetta Jeans, PA-C  sertraline (ZOLOFT) 25 MG tablet TAKE ONE-HALF TABLET DAILY 08/31/15  Yes Brunetta Jeans, PA-C  sitaGLIPtin (JANUVIA) 50 MG tablet Take 50 mg by mouth daily.   Yes Historical Provider, MD  cephALEXin (KEFLEX) 500 MG capsule Take 1 capsule (500 mg total) by mouth 3 (three) times daily. 10/27/15   Tanna Furry, MD   BP 108/62 mmHg  Pulse 79  Temp(Src) 97.9 F (36.6 C) (Oral)  Resp 14  SpO2 99% Physical Exam  Constitutional: She is oriented to person, place, and time. She appears well-developed and well-nourished. No distress.  Frail-appearing elderly female. Awake and alert. Able to answer questions related to her own history.  HENT:  Head: Normocephalic.  Eyes: Conjunctivae are normal. Pupils are equal, round, and reactive to light. No scleral icterus.  Neck: Normal range of motion. Neck supple. No thyromegaly present.  Cardiovascular: Normal rate and regular rhythm.  Exam reveals no gallop and no friction rub.   No murmur heard. Pulmonary/Chest: Effort normal and breath sounds normal. No respiratory distress. She has no wheezes. She has no rales.  Abdominal: Soft. Bowel sounds are normal. She exhibits no distension. There is no tenderness. There is no rebound.  Points to her suprapubic abdomen and describes burning.  Musculoskeletal: Normal range of motion.  Neurological: She is alert and oriented to person, place, and time.  Skin: Skin is warm and dry. No rash  noted.  Psychiatric: She has a normal mood and affect. Her behavior is normal.    ED Course  Procedures (including critical care time) Labs Review Labs Reviewed  BASIC METABOLIC PANEL - Abnormal; Notable for the following:    Sodium 134 (*)    Chloride 100 (*)    Glucose, Bld 396 (*)    All other components within normal limits  CBC - Abnormal; Notable for the following:    Hemoglobin 11.4 (*)    HCT 35.8 (*)    All other components within normal limits  URINALYSIS, ROUTINE W REFLEX MICROSCOPIC (NOT AT [facility]) - Abnormal; Notable for the following:    APPearance CLOUDY (*)    Specific Gravity, Urine 1.041 (*)    Glucose, UA >1000 (*)    Leukocytes, UA MODERATE (*)    All other components within normal limits  URINE MICROSCOPIC-ADD ON - Abnormal; Notable for the following:  Squamous Epithelial / LPF 6-30 (*)    Bacteria, UA MANY (*)    All other components within normal limits  CBG MONITORING, ED - Abnormal; Notable for the following:    Glucose-Capillary 344 (*)    All other components within normal limits  CBG MONITORING, ED - Abnormal; Notable for the following:    Glucose-Capillary 149 (*)    All other components within normal limits    Imaging Review No results found. I have personally reviewed and evaluated these images and lab results as part of my medical decision-making.   EKG Interpretation None      MDM   Final diagnoses:  Hyperglycemia  UTI (lower urinary tract infection)    Blood sugar improves to 149 after fluids and insulin. Has not had an angular Or acidosis. Urine is cloudy with leukocytes and 6-30 white blood cells. Culture pending. Given IV Rocephin. Plan is home, Keflex, continue diabetic meds. Recheck primary care for culture results.    Tanna Furry, MD 10/27/15 [phone]

## 2015-10-28 ENCOUNTER — Telehealth: Payer: Self-pay | Admitting: Physician Assistant

## 2015-10-28 NOTE — Telephone Encounter (Signed)
-----   Message from Brunetta Jeans, PA-C sent at 10/27/2015 10:07 PM EDT ----- Call [facility] -- send over orders.

## 2015-10-28 NOTE — Telephone Encounter (Signed)
Please call [facility] to make sure the Amaryl was discontinued and that they are to continue Januvia and Insulin as directed as long as fasting sugars > 200. Will see her at follow-up on Friday.

## 2015-10-28 NOTE — Telephone Encounter (Signed)
Informed [name], LPN at [facility] of the provider's recommendations below. She verbalized understanding and confirmed that these instructions are currently being followed. Also, reporting that the patient's blood sugar was 385 this morning and she has already been administered her insulin. There were no further questions or concerns prior to the end of call.

## 2015-10-30 ENCOUNTER — Ambulatory Visit (INDEPENDENT_AMBULATORY_CARE_PROVIDER_SITE_OTHER): Payer: Medicare Other | Admitting: Physician Assistant

## 2015-10-30 ENCOUNTER — Encounter: Payer: Self-pay | Admitting: Physician Assistant

## 2015-10-30 VITALS — BP 92/58 | HR 79 | Temp 97.4°F | Ht 59.0 in | Wt 109.8 lb

## 2015-10-30 DIAGNOSIS — E1142 Type 2 diabetes mellitus with diabetic polyneuropathy: Secondary | ICD-10-CM | POA: Diagnosis not present

## 2015-10-30 MED ORDER — BASAGLAR KWIKPEN 100 UNIT/ML ~~LOC~~ SOPN: 10.0000 [IU] | PEN_INJECTOR | Freq: Every day | SUBCUTANEOUS | Status: DC

## 2015-10-30 MED ORDER — BASAGLAR KWIKPEN 100 UNIT/ML ~~LOC~~ SOPN
10.0000 [IU] | PEN_INJECTOR | Freq: Once | SUBCUTANEOUS | Status: AC
  Administered 2015-10-30: 10 [IU] via SUBCUTANEOUS

## 2015-10-30 NOTE — Progress Notes (Signed)
Pre visit review using our clinic review tool, if applicable. No additional management support is needed unless otherwise documented below in the visit note. 

## 2015-10-30 NOTE — Patient Instructions (Signed)
Please have Christine Copeland stay well hydrated and well nourished.  Make sure she finishes the entire course of antibiotic given in the ER.  We are increasing the Insulin Glargine to 10 units daily at noon until she sees her specialist next week.  Follow-up here in office in 1 week just to give a urine sample for testing to make sure infection has cleared.

## 2015-11-01 NOTE — Progress Notes (Signed)
Patient presents to clinic today for follow-up of hyperglycemia and UTI. Patient seen in ER on Tuesday due to increase glucose despite Basaglar and Januvia. Urine was able to be obtained, revealing UTI. Patient was given IM Rocephin and discharged with PO prescription for Keflex. Patient and caregiver endorse patient is taking medication as directed. Denies any residual symptoms. Aide at [place] has been administering insulin and monitoring sugars. Caregiver does not have record of sugar levels. Patient is scheduled to see Endocrinology next week.  Past Medical History  Diagnosis Date  . Diabetes ([place])   . GERD (gastroesophageal reflux disease)   . Idiopathic small intestinal ulcers   . Seasonal allergies   . Hypertension   . Hiatal hernia   . Degenerative arthritis of knee, bilateral     Current Outpatient Prescriptions on File Prior to Visit  Medication Sig Dispense Refill  . acetaminophen (TYLENOL) 500 MG tablet Take 500 mg by mouth 2 (two) times daily.  60 tablet 0  . aspirin 81 MG chewable tablet Chew 81 mg by mouth daily.    . Calcium Carbonate-Vitamin D (CALTRATE 600+D) 600-400 MG-UNIT tablet Take 1 tablet by mouth daily.    . cephALEXin (KEFLEX) 500 MG capsule Take 1 capsule (500 mg total) by mouth 3 (three) times daily. 30 capsule 0  . donepezil (ARICEPT) 10 MG tablet Take 1 tablet (10 mg total) by mouth at bedtime. 30 tablet 3  . fluticasone (FLONASE) 50 MCG/ACT nasal spray Place 1 spray into both nostrils at bedtime.     Marland Kitchen ibuprofen (ADVIL,MOTRIN) 200 MG tablet Take 400 mg by mouth every 6 (six) hours as needed (for pain).    . Lancets (ONETOUCH ULTRASOFT) lancets Use as instructed. Check sugars twice daily. 100 each 12  . losartan (COZAAR) 50 MG tablet Take 0.5 tablets (25 mg total) by mouth daily. 30 tablet 3  . metoprolol succinate (TOPROL-XL) 25 MG 24 hr tablet TAKE ONE-HALF TABLET DAILY 15 tablet 3  . polyethylene glycol (MIRALAX / GLYCOLAX) packet Take 17 g by  mouth at bedtime.     . Psyllium 500 MG CAPS Take 500 mg by mouth at bedtime.    . ranitidine (ZANTAC) 150 MG tablet Take 1 tablet (150 mg total) by mouth 2 (two) times daily. Reported on 09/10/2015 60 tablet 5  . sertraline (ZOLOFT) 25 MG tablet TAKE ONE-HALF TABLET DAILY 15 tablet 5  . sitaGLIPtin (JANUVIA) 50 MG tablet Take 50 mg by mouth daily.     No current facility-administered medications on file prior to visit.    No Known Allergies  Family History  Problem Relation Age of Onset  . Hypertension Mother   . Prostate cancer Father   . Diabetes Mother   . Diabetes Maternal Grandfather   . Tuberculosis Paternal Uncle   . Diabetes Sister     x3  . Stroke Sister     x2  . Arthritis Sister     x2  . Breast cancer Sister     x2  . Hypertension Sister     x5    Social History   Social History  . Marital Status: Single    Spouse Name: N/A  . Number of Children: 0  . Years of Education: N/A   Occupational History  . retired Marine scientist    Social History Main Topics  . Smoking status: Never Smoker   . Smokeless tobacco: Never Used  . Alcohol Use: None  . Drug Use: None  .  Sexual Activity: Not Asked   Other Topics Concern  . None   Social History Narrative    Review of Systems - See HPI.  All other ROS are negative.  BP 92/58 mmHg  Pulse 79  Temp(Src) 97.4 F (36.3 C) (Oral)  Ht 4' 11"  (1.499 m)  Wt 109 lb 12.8 oz (49.805 kg)  BMI 22.17 kg/m2  SpO2 98%  Physical Exam  Constitutional: She is oriented to person, place, and time and well-developed, well-nourished, and in no distress.  HENT:  Head: Normocephalic and atraumatic.  Eyes: Conjunctivae are normal. Pupils are equal, round, and reactive to light.  Neck: Neck supple.  Cardiovascular: Normal rate, regular rhythm, normal heart sounds and intact distal pulses.   Pulmonary/Chest: Effort normal and breath sounds normal. No respiratory distress. She has no wheezes. She has no rales. She exhibits no  tenderness.  Neurological: She is alert and oriented to person, place, and time.  Skin: Skin is warm and dry. No rash noted.  Psychiatric: Affect normal.  Vitals reviewed.   Recent Results (from the past 2160 hour(s))  POCT urinalysis dipstick     Status: Abnormal   Collection Time: 09/10/15 12:26 PM  Result Value Ref Range   Color, UA yellow yellow   Clarity, UA cloudy (A) clear   Glucose, UA >=1,000 (A) negative   Bilirubin, UA negative negative   Ketones, POC UA small (15) (A) negative   Spec Grav, UA 1.020    Blood, UA negative negative   pH, UA 7.0    Protein Ur, POC =30 (A) negative   Urobilinogen, UA 1.0    Nitrite, UA Negative Negative   Leukocytes, UA small (1+) (A) Negative  POCT Microscopic Urinalysis ([facility])     Status: Abnormal   Collection Time: 09/10/15 12:26 PM  Result Value Ref Range   WBC,UR,HPF,POC Moderate (A) None WBC/hpf   RBC,UR,HPF,POC Few (A) None RBC/hpf   Bacteria Few (A) None, Too numerous to count   Mucus Absent Absent   Epithelial Cells, UR Per Microscopy Few (A) None, Too numerous to count cells/hpf  Urine culture     Status: None   Collection Time: 09/10/15  1:18 PM  Result Value Ref Range   Colony Count >=100,000 COLONIES/ML    Organism ID, Bacteria LACTOBACILLUS SPECIES     Comment: Standardized susceptibility testing for this organism is not available.   CBC w/Diff     Status: Abnormal   Collection Time: 10/23/15 12:03 PM  Result Value Ref Range   WBC 7.9 4.0 - 10.5 K/uL   RBC 4.18 3.87 - 5.11 Mil/uL   Hemoglobin 11.7 (L) 12.0 - 15.0 g/dL   HCT 36.8 36.0 - 46.0 %   MCV 88.1 78.0 - 100.0 fl   MCHC 31.7 30.0 - 36.0 g/dL   RDW 13.9 11.5 - 15.5 %   Platelets 226.0 150.0 - 400.0 K/uL   Neutrophils Relative % 76.8 43.0 - 77.0 %   Lymphocytes Relative 11.5 (L) 12.0 - 46.0 %   Monocytes Relative 11.2 3.0 - 12.0 %   Eosinophils Relative 0.3 0.0 - 5.0 %   Basophils Relative 0.2 0.0 - 3.0 %   Neutro Abs 6.1 1.4 - 7.7 K/uL   Lymphs Abs 0.9  0.7 - 4.0 K/uL   Monocytes Absolute 0.9 0.1 - 1.0 K/uL   Eosinophils Absolute 0.0 0.0 - 0.7 K/uL   Basophils Absolute 0.0 0.0 - 0.1 K/uL  Hemoglobin A1c     Status: Abnormal   Collection  Time: 10/23/15 12:03 PM  Result Value Ref Range   Hgb A1c MFr Bld 12.4 (H) 4.6 - 6.5 %    Comment: Glycemic Control Guidelines for People with Diabetes:Non Diabetic:  <6%Goal of Therapy: <7%Additional Action Suggested:  >8%   Comp Met (CMET)     Status: Abnormal   Collection Time: 10/23/15 12:03 PM  Result Value Ref Range   Sodium 131 (L) 135 - 145 mEq/L   Potassium 4.2 3.5 - 5.1 mEq/L   Chloride 94 (L) 96 - 112 mEq/L   CO2 28 19 - 32 mEq/L   Glucose, Bld 381 (H) 70 - 99 mg/dL   BUN 21 6 - 23 mg/dL   Creatinine, Ser 0.72 0.40 - 1.20 mg/dL   Total Bilirubin 0.7 0.2 - 1.2 mg/dL   Alkaline Phosphatase 186 (H) 39 - 117 U/L   AST 25 0 - 37 U/L   ALT 37 (H) 0 - 35 U/L   Total Protein 7.0 6.0 - 8.3 g/dL   Albumin 3.6 3.5 - 5.2 g/dL   Calcium 9.7 8.4 - 10.5 mg/dL   GFR 98.13 >60.00 mL/min  POC Glucose (CBG)     Status: Abnormal   Collection Time: 10/26/15 11:48 AM  Result Value Ref Range   POC Glucose 412 (A) 70 - 99 mg/dl  Comp Met (CMET)     Status: Abnormal   Collection Time: 10/26/15 12:20 PM  Result Value Ref Range   Sodium 131 (L) 135 - 145 mEq/L   Potassium 4.3 3.5 - 5.1 mEq/L   Chloride 96 96 - 112 mEq/L   CO2 29 19 - 32 mEq/L   Glucose, Bld 408 (H) 70 - 99 mg/dL   BUN 19 6 - 23 mg/dL   Creatinine, Ser 0.62 0.40 - 1.20 mg/dL   Total Bilirubin 0.6 0.2 - 1.2 mg/dL   Alkaline Phosphatase 198 (H) 39 - 117 U/L   AST 25 0 - 37 U/L   ALT 30 0 - 35 U/L   Total Protein 6.7 6.0 - 8.3 g/dL   Albumin 3.6 3.5 - 5.2 g/dL   Calcium 9.6 8.4 - 10.5 mg/dL   GFR 116.60 >60.00 mL/min  Gamma GT     Status: Abnormal   Collection Time: 10/26/15 12:20 PM  Result Value Ref Range   GGT 159 (H) 7 - 51 U/L  Urinalysis, Routine w reflex microscopic (not at [facility])     Status: Abnormal   Collection Time:  10/27/15  9:30 AM  Result Value Ref Range   Color, Urine YELLOW YELLOW   APPearance CLOUDY (A) CLEAR   Specific Gravity, Urine 1.041 (H) 1.005 - 1.030   pH 7.0 5.0 - 8.0   Glucose, UA >1000 (A) NEGATIVE mg/dL   Hgb urine dipstick NEGATIVE NEGATIVE   Bilirubin Urine NEGATIVE NEGATIVE   Ketones, ur NEGATIVE NEGATIVE mg/dL   Protein, ur NEGATIVE NEGATIVE mg/dL   Nitrite NEGATIVE NEGATIVE   Leukocytes, UA MODERATE (A) NEGATIVE  Urine microscopic-add on     Status: Abnormal   Collection Time: 10/27/15  9:30 AM  Result Value Ref Range   Squamous Epithelial / LPF 6-30 (A) NONE SEEN   WBC, UA 6-30 0 - 5 WBC/hpf   RBC / HPF 0-5 0 - 5 RBC/hpf   Bacteria, UA MANY (A) NONE SEEN   Urine-Other MUCOUS PRESENT     Comment: YEAST PRESENT AMORPHOUS URATES/PHOSPHATES   Basic metabolic panel     Status: Abnormal   Collection Time: 10/27/15 10:02 AM  Result Value Ref Range   Sodium 134 (L) 135 - 145 mmol/L   Potassium 5.0 3.5 - 5.1 mmol/L   Chloride 100 (L) 101 - 111 mmol/L   CO2 26 22 - 32 mmol/L   Glucose, Bld 396 (H) 65 - 99 mg/dL   BUN 18 6 - 20 mg/dL   Creatinine, Ser 0.63 0.44 - 1.00 mg/dL   Calcium 9.1 8.9 - 10.3 mg/dL   GFR calc non Af Amer >60 >60 mL/min   GFR calc Af Amer >60 >60 mL/min    Comment: (NOTE) The eGFR has been calculated using the CKD EPI equation. This calculation has not been validated in all clinical situations. eGFR's persistently <60 mL/min signify possible Chronic Kidney Disease.    Anion gap 8 5 - 15  CBC     Status: Abnormal   Collection Time: 10/27/15 10:02 AM  Result Value Ref Range   WBC 7.5 4.0 - 10.5 K/uL   RBC 4.00 3.87 - 5.11 MIL/uL   Hemoglobin 11.4 (L) 12.0 - 15.0 g/dL   HCT 35.8 (L) 36.0 - 46.0 %   MCV 89.5 78.0 - 100.0 fL   MCH 28.5 26.0 - 34.0 pg   MCHC 31.8 30.0 - 36.0 g/dL   RDW 13.5 11.5 - 15.5 %   Platelets 193 150 - 400 K/uL  CBG monitoring, ED     Status: Abnormal   Collection Time: 10/27/15 10:53 AM  Result Value Ref Range    Glucose-Capillary 344 (H) 65 - 99 mg/dL   Comment 1 Notify RN    Comment 2 Document in Chart   POC CBG, ED     Status: Abnormal   Collection Time: 10/27/15 12:53 PM  Result Value Ref Range   Glucose-Capillary 149 (H) 65 - 99 mg/dL    Assessment/Plan: Diabetes (HCC) Non-fasting sugar today at 243, much improved from first presentation. Patient to finish entire course of antibiotic for UTI. This will help sugar levels. Will increase Basaglar 10 units today. Injection given by nursing staff. Aide to continue checking sugars. Instructions for administration faxed to [facility]. Follow-up with Endocrinology as scheduled next week.

## 2015-11-01 NOTE — Assessment & Plan Note (Signed)
Non-fasting sugar today at 243, much improved from first presentation. Patient to finish entire course of antibiotic for UTI. This will help sugar levels. Will increase Basaglar 10 units today. Injection given by nursing staff. Aide to continue checking sugars. Instructions for administration faxed to [facility]. Follow-up with Endocrinology as scheduled next week.

## 2015-11-03 ENCOUNTER — Other Ambulatory Visit: Payer: Self-pay | Admitting: Physician Assistant

## 2015-11-03 ENCOUNTER — Telehealth: Payer: Self-pay | Admitting: Physician Assistant

## 2015-11-03 NOTE — Telephone Encounter (Signed)
Informed pt's niece of plan. [facility], they do not have any short-acting insulin on hand to give pt as all of their meds are pt specific and they do not have floor stock. For pt to receive short acting insulin, we will have to send rx to pharmacy. Please advise if you would like to send in rx or have pt proceed to ED.

## 2015-11-03 NOTE — Telephone Encounter (Signed)
Informed Mliss Sax of below. She verbalized understanding, but said she was not sure if pt would agree to ED assessment. She said she would like to have pt's CBG checked again. Advised that would be okay, but that CBG would likely be higher now since pt has eaten and again recommended ED eval per [place]. She agreed to instructions.

## 2015-11-03 NOTE — Telephone Encounter (Signed)
Please advise. Pt has new pt appt w/ Dr. Loanne Drilling tomorrow.

## 2015-11-03 NOTE — Telephone Encounter (Signed)
Caller name: Marline Backbone, POA/neice Can be reached: [phone] Pharmacy:  Reason for call: [facility] is in [facility]. [facility] living called Bernadette at 7:00am telling her pt blood sugar was 503 prior to eating (taken at 6:52am). Pt was having trouble getting up but has now been up and eaten. She called Dr. Cordelia Pen office to see if they could see pt sooner but they cannot. Pt is scheduled for new pt 11/04/15. Mliss Sax not sure if she should retake blood sugar or take pt to hospital or here. Please call to notify.

## 2015-11-03 NOTE — Telephone Encounter (Signed)
Please call caregiver and Assisted Living -- we need to see if they have regular short-acting insulin on hand at the facility. If so, recommend we give Christine Copeland 10 units and monitor Christine Copeland sugar at 20-30 minutes post injection and 1 hour post-injection and call us with the results (goal is to get sugar 300 or less until Christine Copeland noon dose of long-acting insulin). If they do not have insulin on hand at the facility then she needs to go to an ER as we do not have short-acting insulin here.

## 2015-11-03 NOTE — Telephone Encounter (Signed)
Received call from [place]. She is at [facility] and states that they just re-checked pt's CBG and it was 235. Pt is due for her daily insulin dose w/ in the hour. She asked if it was okay to hold off on taking pt to ED. Advised that per [place], CBG goal < 300, so ok to hold off on ED eval and proceed w/ scheduled appt w/ Dr. Loanne Drilling tomorrow.

## 2015-11-03 NOTE — Telephone Encounter (Signed)
Pt's niece called back in to provide  Pharmacy info. : [facility] on [address].

## 2015-11-03 NOTE — Telephone Encounter (Signed)
Recommend ER for assessment since she has eaten, her sugars most likely even higher now and there is concern that urine infection is still present since sugars still so elevated.

## 2015-11-04 ENCOUNTER — Encounter: Payer: Self-pay | Admitting: Endocrinology

## 2015-11-04 ENCOUNTER — Ambulatory Visit (INDEPENDENT_AMBULATORY_CARE_PROVIDER_SITE_OTHER): Payer: Medicare Other | Admitting: Endocrinology

## 2015-11-04 ENCOUNTER — Other Ambulatory Visit: Payer: Self-pay

## 2015-11-04 VITALS — BP 122/64 | HR 77 | Temp 98.2°F | Ht 59.0 in | Wt 112.0 lb

## 2015-11-04 DIAGNOSIS — Z794 Long term (current) use of insulin: Secondary | ICD-10-CM | POA: Diagnosis not present

## 2015-11-04 DIAGNOSIS — E1142 Type 2 diabetes mellitus with diabetic polyneuropathy: Secondary | ICD-10-CM | POA: Diagnosis not present

## 2015-11-04 MED ORDER — BASAGLAR KWIKPEN 100 UNIT/ML ~~LOC~~ SOPN: 15.0000 [IU] | PEN_INJECTOR | Freq: Every day | SUBCUTANEOUS | Status: DC

## 2015-11-04 MED ORDER — BASAGLAR KWIKPEN 100 UNIT/ML ~~LOC~~ SOPN: 15.0000 [IU] | PEN_INJECTOR | Freq: Every day | SUBCUTANEOUS | Status: AC

## 2015-11-04 NOTE — Patient Instructions (Addendum)
Please increase resident's cbg's to QID: QAC and QHS. Please increase basaglar to 15 units sq qam.  good diet and exercise significantly improve the control of your diabetes.  high blood sugar is very risky to your health.  you should see an eye doctor and dentist every year.  It is very important to get all recommended vaccinations.  controlling your blood pressure drastically reduces the damage diabetes does to your body.  Those who smoke should quit.  please discuss these with your doctor.  it is critically important to prevent falling down (keep floor areas well-lit, dry, and free of loose objects.  If you have a cane, walker, or wheelchair, you should use it, even for short trips around the house.  Wear flat-soled shoes.  Also, try not to rush) Please fax cbg record in 2 days (11/06/15).   Please come back for a follow-up appointment in 1 month.

## 2015-11-04 NOTE — Progress Notes (Signed)
Subjective:    Patient ID: Christine Copeland, female    DOB: [birth year], 82 y.o.   MRN: [id number]  HPI pt states DM was dx'ed in 2007; she has mild neuropathy of the lower extremities; she is unaware of any associated chronic complications; she has been on insulin since she was seen in ER last week; she was started on lantus 10 units qd.  pt says her diet is good, but exercise are is limited by frail elderly state; she has never had GDM, pancreatitis, severe hypoglycemia or DKA.  She lives at [facility], where insulin is administered to her, and cbg's are checked.  She says several family members are or were lean, and take or took insulin, during their lives.  As pt is able to provide only limited recent hx, I asked her grand niece.  She says pt is feeling pretty well despite recent dx of DM (only symptom is slight generalized weakness). Past Medical History  Diagnosis Date  . Diabetes ([place])   . GERD (gastroesophageal reflux disease)   . Idiopathic small intestinal ulcers   . Seasonal allergies   . Hypertension   . Hiatal hernia   . Degenerative arthritis of knee, bilateral     Past Surgical History  Procedure Laterality Date  . Dilation and curettage of uterus      x3  . Knee arthroscopy      left  . Wisdom tooth extraction      Social History   Social History  . Marital Status: Single    Spouse Name: N/A  . Number of Children: 0  . Years of Education: N/A   Occupational History  . retired Marine scientist    Social History Main Topics  . Smoking status: Never Smoker   . Smokeless tobacco: Never Used  . Alcohol Use: Not on file  . Drug Use: Not on file  . Sexual Activity: Not on file   Other Topics Concern  . Not on file   Social History Narrative    Current Outpatient Prescriptions on File Prior to Visit  Medication Sig Dispense Refill  . acetaminophen (TYLENOL) 500 MG tablet Take 500 mg by mouth 2 (two) times daily.  60 tablet 0  . aspirin 81 MG chewable tablet Chew  81 mg by mouth daily.    . Calcium Carbonate-Vitamin D (CALTRATE 600+D) 600-400 MG-UNIT tablet Take 1 tablet by mouth daily.    . cephALEXin (KEFLEX) 500 MG capsule Take 1 capsule (500 mg total) by mouth 3 (three) times daily. 30 capsule 0  . donepezil (ARICEPT) 10 MG tablet Take 1 tablet (10 mg total) by mouth at bedtime. 30 tablet 3  . fluticasone (FLONASE) 50 MCG/ACT nasal spray Place 1 spray into both nostrils at bedtime.     [place] Kitchen ibuprofen (ADVIL,MOTRIN) 200 MG tablet Take 400 mg by mouth every 6 (six) hours as needed (for pain).    [place] Kitchen JANUVIA 50 MG tablet TAKE ONE TABLET BY MOUTH ONCE DAILY 30 tablet 5  . Lancets (ONETOUCH ULTRASOFT) lancets Use as instructed. Check sugars twice daily. 100 each 12  . losartan (COZAAR) 50 MG tablet Take 0.5 tablets (25 mg total) by mouth daily. 30 tablet 3  . metoprolol succinate (TOPROL-XL) 25 MG 24 hr tablet TAKE ONE-HALF TABLET DAILY 15 tablet 3  . polyethylene glycol (MIRALAX / GLYCOLAX) packet Take 17 g by mouth at bedtime.     . Psyllium 500 MG CAPS Take 500 mg by mouth at bedtime.    [place] Kitchen  ranitidine (ZANTAC) 150 MG tablet Take 1 tablet (150 mg total) by mouth 2 (two) times daily. Reported on 09/10/2015 60 tablet 5  . sertraline (ZOLOFT) 25 MG tablet TAKE ONE-HALF TABLET DAILY 15 tablet 5   No current facility-administered medications on file prior to visit.    No Known Allergies  Family History  Problem Relation Age of Onset  . Hypertension Mother   . Prostate cancer Father   . Diabetes Mother   . Diabetes Maternal Grandfather   . Tuberculosis Paternal Uncle   . Diabetes Sister     x3  . Stroke Sister     x2  . Arthritis Sister     x2  . Breast cancer Sister     x2  . Hypertension Sister     x5   BP 122/64 mmHg  Pulse 77  Temp(Src) 98.2 F (36.8 C) (Oral)  Ht 4\' 11"  (1.499 m)  Wt 112 lb (50.803 kg)  BMI 22.61 kg/m2  SpO2 98%  Review of Systems denies weight loss, blurry vision, headache, chest pain, sob, n/v, urinary frequency,  muscle cramps, excessive diaphoresis, memory loss, depression, cold intolerance, rhinorrhea, and easy bruising.   Frequent urination is improved.       Objective:   Physical Exam  Vital signs: see vs page Gen: elderly, frail, no distress HEAD: head: no deformity eyes: no periorbital swelling, no proptosis external nose and ears are normal mouth: no lesion seen NECK: supple, thyroid is not enlarged CHEST WALL: no deformity LUNGS: clear to auscultation CV: reg rate and rhythm, no murmur ABD: abdomen is soft, nontender.  no hepatosplenomegaly.  not distended.  no hernia MUSCULOSKELETAL: muscle bulk and strength are grossly normal.  no obvious joint swelling.  gait is steady with a walker EXTEMITIES: no deformity.  no edema PULSES: no carotid bruit NEURO:  cn 2-12 grossly intact.   readily moves all 4's.  sensation is intact to touch on all 4's SKIN:  Normal texture and temperature.  No rash or suspicious lesion is visible.   NODES:  None palpable at the neck PSYCH: alert, well-oriented.  Does not appear anxious nor depressed.    Lab Results  Component Value Date   HGBA1C 12.4* 10/23/2015   I have reviewed outside records, and summarized: Pt was noted to have severe hyperglycemia in the ER (but not DKA), and referred here. I have also reviewed [facility]bg record from [facility]:  It varies from 250-450.  There is no trend throughout the day.    Assessment & Plan:  DM: severe exacerbation:  Lean body habitus suggests dx of Type 1.  we'll increase the insulin slowly, as she may be very insulin sensitive, and because she is tolerating hyperglycemia well.   Frail elderly state: in this setting, she is not a candidate for aggressive glycemic control.    Patient is advised the following: Patient Instructions  Please increase resident's cbg's to QID: QAC and QHS. Please increase basaglar to 15 units sq qam.  good diet and exercise significantly improve the control of your diabetes.  high  blood sugar is very risky to your health.  you should see an eye doctor and dentist every year.  It is very important to get all recommended vaccinations.  controlling your blood pressure drastically reduces the damage diabetes does to your body.  Those who smoke should quit.  please discuss these with your doctor.  it is critically important to prevent falling down (keep floor areas well-lit, dry, and free  of loose objects.  If you have a cane, walker, or wheelchair, you should use it, even for short trips around the house.  Wear flat-soled shoes.  Also, try not to rush) Please fax cbg record in 2 days (11/06/15).   Please come back for a follow-up appointment in 1 month.

## 2015-11-06 ENCOUNTER — Ambulatory Visit (INDEPENDENT_AMBULATORY_CARE_PROVIDER_SITE_OTHER): Payer: Medicare Other | Admitting: Physician Assistant

## 2015-11-06 ENCOUNTER — Encounter: Payer: Self-pay | Admitting: Physician Assistant

## 2015-11-06 ENCOUNTER — Telehealth: Payer: Self-pay | Admitting: Physician Assistant

## 2015-11-06 ENCOUNTER — Ambulatory Visit (HOSPITAL_BASED_OUTPATIENT_CLINIC_OR_DEPARTMENT_OTHER)
Admission: RE | Admit: 2015-11-06 | Discharge: 2015-11-06 | Disposition: A | Discharge status: Home / Self Care | Payer: Medicare Other | Source: Ambulatory Visit | Attending: Physician Assistant | Admitting: Physician Assistant

## 2015-11-06 VITALS — BP 110/70 | HR 84 | Temp 96.2°F | Ht 59.0 in | Wt 111.4 lb

## 2015-11-06 DIAGNOSIS — R188 Other ascites: Secondary | ICD-10-CM | POA: Insufficient documentation

## 2015-11-06 DIAGNOSIS — N3000 Acute cystitis without hematuria: Secondary | ICD-10-CM | POA: Diagnosis not present

## 2015-11-06 DIAGNOSIS — R748 Abnormal levels of other serum enzymes: Secondary | ICD-10-CM

## 2015-11-06 DIAGNOSIS — K769 Liver disease, unspecified: Secondary | ICD-10-CM | POA: Insufficient documentation

## 2015-11-06 DIAGNOSIS — Z794 Long term (current) use of insulin: Secondary | ICD-10-CM

## 2015-11-06 DIAGNOSIS — I10 Essential (primary) hypertension: Secondary | ICD-10-CM | POA: Diagnosis not present

## 2015-11-06 DIAGNOSIS — E1142 Type 2 diabetes mellitus with diabetic polyneuropathy: Secondary | ICD-10-CM | POA: Diagnosis not present

## 2015-11-06 LAB — POC URINALSYSI DIPSTICK (AUTOMATED)
Bilirubin, UA: NEGATIVE
Blood, UA: NEGATIVE
Ketones, UA: NEGATIVE
LEUKOCYTES UA: NEGATIVE
Nitrite, UA: NEGATIVE
PROTEIN UA: NEGATIVE
Spec Grav, UA: 1.02
pH, UA: 6.5

## 2015-11-06 NOTE — Patient Instructions (Signed)
Please go downstairs for imaging at 2:30. They will be obtaining an [place] due to your recent lab results. No food or drinking until imaging is obtained. I will call you with your results.  Repeat urine looks good. I will send for culture to verify resolution of infection.  Please continue medications as directed. Follow with Dr. Loanne Drilling as directed. Stay hydrated and eat a well-balanced diet.

## 2015-11-06 NOTE — Telephone Encounter (Signed)
Called and informed [facility] that Christine Copeland has already viewed the results on MyChart.//AB/CMA

## 2015-11-06 NOTE — Progress Notes (Signed)
Patient presents to clinic today for follow-up on glucose levels and for elevated Alk phos and GGT levels.  Patient and caregiver endorse they have seen Endocrinology. They are titrating long-acting insulin to get better control over sugars. Is currently on 15 units of Basaglar with 50 mg Januvia daily. Blood sugars are averaging in mid 200s at present. Has follow-up scheduled.  Alk phos was noted to be elevated at 186 on labs 10/23/15. Repeat lfts and a GGT were assessed showing alk phos at 198 and GGT at 159. Caregiver was to set up follow-up with patient's GI provider but has been unable to do so. Patient denies nausea, vomiting, diarrhea. Notes some chronic intermittent abdominal cramping.   Past Medical History  Diagnosis Date  . Diabetes ([place])   . GERD (gastroesophageal reflux disease)   . Idiopathic small intestinal ulcers   . Seasonal allergies   . Hypertension   . Hiatal hernia   . Degenerative arthritis of knee, bilateral     Current Outpatient Prescriptions on File Prior to Visit  Medication Sig Dispense Refill  . acetaminophen (TYLENOL) 500 MG tablet Take 500 mg by mouth 2 (two) times daily.  60 tablet 0  . aspirin 81 MG chewable tablet Chew 81 mg by mouth daily.    . Calcium Carbonate-Vitamin D (CALTRATE 600+D) 600-400 MG-UNIT tablet Take 1 tablet by mouth daily.    [place] Kitchen donepezil (ARICEPT) 10 MG tablet Take 1 tablet (10 mg total) by mouth at bedtime. 30 tablet 3  . fluticasone (FLONASE) 50 MCG/ACT nasal spray Place 1 spray into both nostrils at bedtime.     [place] Kitchen ibuprofen (ADVIL,MOTRIN) 200 MG tablet Take 400 mg by mouth every 6 (six) hours as needed (for pain).    . Insulin Glargine (BASAGLAR KWIKPEN) 100 UNIT/ML SOPN Inject 0.15 mLs (15 Units total) into the skin daily at 12 noon. 3 mL 0  . JANUVIA 50 MG tablet TAKE ONE TABLET BY MOUTH ONCE DAILY 30 tablet 5  . Lancets (ONETOUCH ULTRASOFT) lancets Use as instructed. Check sugars twice daily. 100 each 12  . losartan (COZAAR)  50 MG tablet Take 0.5 tablets (25 mg total) by mouth daily. 30 tablet 3  . metoprolol succinate (TOPROL-XL) 25 MG 24 hr tablet TAKE ONE-HALF TABLET DAILY 15 tablet 3  . polyethylene glycol (MIRALAX / GLYCOLAX) packet Take 17 g by mouth at bedtime.     . Psyllium 500 MG CAPS Take 500 mg by mouth at bedtime.    . ranitidine (ZANTAC) 150 MG tablet Take 1 tablet (150 mg total) by mouth 2 (two) times daily. Reported on 09/10/2015 60 tablet 5  . sertraline (ZOLOFT) 25 MG tablet TAKE ONE-HALF TABLET DAILY 15 tablet 5   No current facility-administered medications on file prior to visit.    No Known Allergies  Family History  Problem Relation Age of Onset  . Hypertension Mother   . Prostate cancer Father   . Diabetes Mother   . Diabetes Maternal Grandfather   . Tuberculosis Paternal Uncle   . Diabetes Sister     x3  . Stroke Sister     x2  . Arthritis Sister     x2  . Breast cancer Sister     x2  . Hypertension Sister     x5    Social History   Social History  . Marital Status: Single    Spouse Name: N/A  . Number of Children: 0  . Years of Education: N/A   Occupational  History  . retired Marine scientist    Social History Main Topics  . Smoking status: Never Smoker   . Smokeless tobacco: Never Used  . Alcohol Use: None  . Drug Use: None  . Sexual Activity: Not Asked   Other Topics Concern  . None   Social History Narrative    Review of Systems - See HPI.  All other ROS are negative.  BP 110/70 mmHg  Pulse 84  Temp(Src) 96.2 F (35.7 C) (Axillary)  Ht _0  (1.499 m)  Wt 111 lb 6.4 oz (50.531 kg)  BMI 22.49 kg/m2  SpO2 96%  Physical Exam  Constitutional: She is oriented to person, place, and time and well-developed, well-nourished, and in no distress.  HENT:  Head: Normocephalic and atraumatic.  Eyes: Conjunctivae are normal.  Neck: Neck supple.  Cardiovascular: Normal rate, regular rhythm, normal heart sounds and intact distal pulses.   Pulmonary/Chest: Effort  normal and breath sounds normal. No respiratory distress. She has no wheezes. She has no rales. She exhibits no tenderness.  Abdominal: Soft. Bowel sounds are normal. She exhibits no distension. There is no tenderness.  Neurological: She is alert and oriented to person, place, and time.  Skin: Skin is warm and dry. No rash noted.  Psychiatric: Affect normal.  Vitals reviewed.   Recent Results (from the past 2160 hour(s))  POCT urinalysis dipstick     Status: Abnormal   Collection Time: 09/10/15 12:26 PM  Result Value Ref Range   Color, UA yellow yellow   Clarity, UA cloudy (A) clear   Glucose, UA >=1,000 (A) negative   Bilirubin, UA negative negative   Ketones, POC UA small (15) (A) negative   Spec Grav, UA 1.020    Blood, UA negative negative   pH, UA 7.0    Protein Ur, POC =30 (A) negative   Urobilinogen, UA 1.0    Nitrite, UA Negative Negative   Leukocytes, UA small (1+) (A) Negative  POCT Microscopic Urinalysis ([facility])     Status: Abnormal   Collection Time: 09/10/15 12:26 PM  Result Value Ref Range   WBC,UR,HPF,POC Moderate (A) None WBC/hpf   RBC,UR,HPF,POC Few (A) None RBC/hpf   Bacteria Few (A) None, Too numerous to count   Mucus Absent Absent   Epithelial Cells, UR Per Microscopy Few (A) None, Too numerous to count cells/hpf  Urine culture     Status: None   Collection Time: 09/10/15  1:18 PM  Result Value Ref Range   Colony Count >=100,000 COLONIES/ML    Organism ID, Bacteria LACTOBACILLUS SPECIES     Comment: Standardized susceptibility testing for this organism is not available.   CBC w/Diff     Status: Abnormal   Collection Time: 10/23/15 12:03 PM  Result Value Ref Range   WBC 7.9 4.0 - 10.5 K/uL   RBC 4.18 3.87 - 5.11 Mil/uL   Hemoglobin 11.7 (L) 12.0 - 15.0 g/dL   HCT 36.8 36.0 - 46.0 %   MCV 88.1 78.0 - 100.0 fl   MCHC 31.7 30.0 - 36.0 g/dL   RDW 13.9 11.5 - 15.5 %   Platelets 226.0 150.0 - 400.0 K/uL   Neutrophils Relative % 76.8 43.0 - 77.0 %    Lymphocytes Relative 11.5 (L) 12.0 - 46.0 %   Monocytes Relative 11.2 3.0 - 12.0 %   Eosinophils Relative 0.3 0.0 - 5.0 %   Basophils Relative 0.2 0.0 - 3.0 %   Neutro Abs 6.1 1.4 - 7.7 K/uL   Lymphs Abs  0.9 0.7 - 4.0 K/uL   Monocytes Absolute 0.9 0.1 - 1.0 K/uL   Eosinophils Absolute 0.0 0.0 - 0.7 K/uL   Basophils Absolute 0.0 0.0 - 0.1 K/uL  Hemoglobin A1c     Status: Abnormal   Collection Time: 10/23/15 12:03 PM  Result Value Ref Range   Hgb A1c MFr Bld 12.4 (H) 4.6 - 6.5 %    Comment: Glycemic Control Guidelines for People with Diabetes:Non Diabetic:  <6%Goal of Therapy: <7%Additional Action Suggested:  >8%   Comp Met (CMET)     Status: Abnormal   Collection Time: 10/23/15 12:03 PM  Result Value Ref Range   Sodium 131 (L) 135 - 145 mEq/L   Potassium 4.2 3.5 - 5.1 mEq/L   Chloride 94 (L) 96 - 112 mEq/L   CO2 28 19 - 32 mEq/L   Glucose, Bld 381 (H) 70 - 99 mg/dL   BUN 21 6 - 23 mg/dL   Creatinine, Ser 0.72 0.40 - 1.20 mg/dL   Total Bilirubin 0.7 0.2 - 1.2 mg/dL   Alkaline Phosphatase 186 (H) 39 - 117 U/L   AST 25 0 - 37 U/L   ALT 37 (H) 0 - 35 U/L   Total Protein 7.0 6.0 - 8.3 g/dL   Albumin 3.6 3.5 - 5.2 g/dL   Calcium 9.7 8.4 - 10.5 mg/dL   GFR 98.13 >60.00 mL/min  POC Glucose (CBG)     Status: Abnormal   Collection Time: 10/26/15 11:48 AM  Result Value Ref Range   POC Glucose 412 (A) 70 - 99 mg/dl  Comp Met (CMET)     Status: Abnormal   Collection Time: 10/26/15 12:20 PM  Result Value Ref Range   Sodium 131 (L) 135 - 145 mEq/L   Potassium 4.3 3.5 - 5.1 mEq/L   Chloride 96 96 - 112 mEq/L   CO2 29 19 - 32 mEq/L   Glucose, Bld 408 (H) 70 - 99 mg/dL   BUN 19 6 - 23 mg/dL   Creatinine, Ser 0.62 0.40 - 1.20 mg/dL   Total Bilirubin 0.6 0.2 - 1.2 mg/dL   Alkaline Phosphatase 198 (H) 39 - 117 U/L   AST 25 0 - 37 U/L   ALT 30 0 - 35 U/L   Total Protein 6.7 6.0 - 8.3 g/dL   Albumin 3.6 3.5 - 5.2 g/dL   Calcium 9.6 8.4 - 10.5 mg/dL   GFR 116.60 >60.00 mL/min  Gamma GT      Status: Abnormal   Collection Time: 10/26/15 12:20 PM  Result Value Ref Range   GGT 159 (H) 7 - 51 U/L  Urinalysis, Routine w reflex microscopic (not at [facility])     Status: Abnormal   Collection Time: 10/27/15  9:30 AM  Result Value Ref Range   Color, Urine YELLOW YELLOW   APPearance CLOUDY (A) CLEAR   Specific Gravity, Urine 1.041 (H) 1.005 - 1.030   pH 7.0 5.0 - 8.0   Glucose, UA >1000 (A) NEGATIVE mg/dL   Hgb urine dipstick NEGATIVE NEGATIVE   Bilirubin Urine NEGATIVE NEGATIVE   Ketones, ur NEGATIVE NEGATIVE mg/dL   Protein, ur NEGATIVE NEGATIVE mg/dL   Nitrite NEGATIVE NEGATIVE   Leukocytes, UA MODERATE (A) NEGATIVE  Urine microscopic-add on     Status: Abnormal   Collection Time: 10/27/15  9:30 AM  Result Value Ref Range   Squamous Epithelial / LPF 6-30 (A) NONE SEEN   WBC, UA 6-30 0 - 5 WBC/hpf   RBC / HPF 0-5  0 - 5 RBC/hpf   Bacteria, UA MANY (A) NONE SEEN   Urine-Other MUCOUS PRESENT     Comment: YEAST PRESENT AMORPHOUS URATES/PHOSPHATES   Basic metabolic panel     Status: Abnormal   Collection Time: 10/27/15 10:02 AM  Result Value Ref Range   Sodium 134 (L) 135 - 145 mmol/L   Potassium 5.0 3.5 - 5.1 mmol/L   Chloride 100 (L) 101 - 111 mmol/L   CO2 26 22 - 32 mmol/L   Glucose, Bld 396 (H) 65 - 99 mg/dL   BUN 18 6 - 20 mg/dL   Creatinine, Ser 0.63 0.44 - 1.00 mg/dL   Calcium 9.1 8.9 - 10.3 mg/dL   GFR calc non Af Amer >60 >60 mL/min   GFR calc Af Amer >60 >60 mL/min    Comment: (NOTE) The eGFR has been calculated using the CKD EPI equation. This calculation has not been validated in all clinical situations. eGFR's persistently <60 mL/min signify possible Chronic Kidney Disease.    Anion gap 8 5 - 15  CBC     Status: Abnormal   Collection Time: 10/27/15 10:02 AM  Result Value Ref Range   WBC 7.5 4.0 - 10.5 K/uL   RBC 4.00 3.87 - 5.11 MIL/uL   Hemoglobin 11.4 (L) 12.0 - 15.0 g/dL   HCT 35.8 (L) 36.0 - 46.0 %   MCV 89.5 78.0 - 100.0 fL   MCH 28.5 26.0 -  34.0 pg   MCHC 31.8 30.0 - 36.0 g/dL   RDW 13.5 11.5 - 15.5 %   Platelets 193 150 - 400 K/uL  CBG monitoring, ED     Status: Abnormal   Collection Time: 10/27/15 10:53 AM  Result Value Ref Range   Glucose-Capillary 344 (H) 65 - 99 mg/dL   Comment 1 Notify RN    Comment 2 Document in Chart   POC CBG, ED     Status: Abnormal   Collection Time: 10/27/15 12:53 PM  Result Value Ref Range   Glucose-Capillary 149 (H) 65 - 99 mg/dL  POCT Urinalysis Dipstick (Automated)     Status: None   Collection Time: 11/06/15 12:04 PM  Result Value Ref Range   Color, UA Amber    Clarity, UA clear    Glucose, UA 3+    Bilirubin, UA neg    Ketones, UA neg    Spec Grav, UA 1.020    Blood, UA neg    pH, UA 6.5    Protein, UA neg    Urobilinogen, UA >=8.0    Nitrite, UA neg    Leukocytes, UA Negative Negative  CULTURE, URINE COMPREHENSIVE     Status: None   Collection Time: 11/06/15  1:43 PM  Result Value Ref Range   Colony Count 30,000 COLONIES/ML    Organism ID, Bacteria Yeast     Assessment/Plan: Essential hypertension, benign BP stable on lower dose of losartan. Will continue the same.  Diabetes ([place]) Followed by Endocrinology. Sugars are improving. Will continue titrating insulin as directed by specialist. FU scheduled.  Elevated alkaline phosphatase level Will proceed with US liver to further assess these elevations. Concern for malignancy giving advanced age and these numbers. Follow-up scheduled with GI provider next week. Will proceed with further assessment based on [place] results.  Acute cystitis without hematuria Has finished antibiotic. Urine dip unremarkable except for glucose which is expected due to DM. Will send urine culture to ensure resolution.

## 2015-11-06 NOTE — Telephone Encounter (Signed)
Caller name: Erline Levine from [facility]  Call back number: [phone]    Reason for call:  Calling to inform regarding imaging results

## 2015-11-06 NOTE — Progress Notes (Signed)
Pre visit review using our clinic review tool, if applicable. No additional management support is needed unless otherwise documented below in the visit note. 

## 2015-11-08 ENCOUNTER — Encounter: Payer: Self-pay | Admitting: Physician Assistant

## 2015-11-08 DIAGNOSIS — N3000 Acute cystitis without hematuria: Secondary | ICD-10-CM | POA: Insufficient documentation

## 2015-11-08 LAB — CULTURE, URINE COMPREHENSIVE: Colony Count: 30000

## 2015-11-08 NOTE — Assessment & Plan Note (Signed)
Followed by Endocrinology. Sugars are improving. Will continue titrating insulin as directed by specialist. FU scheduled.

## 2015-11-08 NOTE — Assessment & Plan Note (Signed)
Has finished antibiotic. Urine dip unremarkable except for glucose which is expected due to DM. Will send urine culture to ensure resolution.

## 2015-11-08 NOTE — Assessment & Plan Note (Signed)
BP stable on lower dose of losartan. Will continue the same.

## 2015-11-08 NOTE — Assessment & Plan Note (Signed)
Will proceed with US liver to further assess these elevations. Concern for malignancy giving advanced age and these numbers. Follow-up scheduled with GI provider next week. Will proceed with further assessment based on [place] results.

## 2015-11-09 ENCOUNTER — Telehealth: Payer: Self-pay | Admitting: Physician Assistant

## 2015-11-09 ENCOUNTER — Encounter: Payer: Self-pay | Admitting: Physician Assistant

## 2015-11-09 ENCOUNTER — Other Ambulatory Visit: Payer: Self-pay | Admitting: Physician Assistant

## 2015-11-09 DIAGNOSIS — K769 Liver disease, unspecified: Secondary | ICD-10-CM

## 2015-11-09 DIAGNOSIS — C799 Secondary malignant neoplasm of unspecified site: Secondary | ICD-10-CM

## 2015-11-09 NOTE — Telephone Encounter (Signed)
Caller name: Gerald Stabs Relationship to patient: Christine Copeland Can be reached: [phone]    Reason for call: Gerald Stabs from [facility] called because an order was sent to them but he is sending a clarification to that order via fax because she has already had her insulin for today.

## 2015-11-10 ENCOUNTER — Ambulatory Visit (HOSPITAL_BASED_OUTPATIENT_CLINIC_OR_DEPARTMENT_OTHER): Payer: Medicare Other

## 2015-11-10 ENCOUNTER — Telehealth: Payer: Self-pay | Admitting: Physician Assistant

## 2015-11-10 NOTE — Telephone Encounter (Signed)
Called and spoke with [name] with [facility] regarding the pt's medications.//AB/CMA

## 2015-11-10 NOTE — Telephone Encounter (Signed)
Called and spoke with [name] with [facility] and informed her per Einar Pheasant to go ahead and administer the morning meds and other scheduled medications.  Per Einar Pheasant the pt is now been seen by Dr. Loanne Drilling (Endo) and if they have any questions regarding her blood sugars or medications they can call Dr. Loanne Drilling.  [name] stated that from the letter that they received from [facility] on yesterday they was told to call [facility] with blood sugars readings before administering medications.   Informed Angela-Per Einar Pheasant that he appreciate her calling and informing him of the pt's readings.  Also informed [name] that Dr. Loanne Drilling is out of the office this week but he does have someone in the office that will be able contact if they need to.  [name] verbalized understanding and agreed.//AB/CMA

## 2015-11-10 NOTE — Telephone Encounter (Signed)
Caller name: [name] with [facility] Can be reached: [phone]  Reason for call: Pts blood sugar is 238 before taking Januvia. They said they were supposed to call before administering meds this morning. Pt is due for meds at 9:00am and they will be given if they have not heard from [place] otherwise. Please call asap.

## 2015-11-11 ENCOUNTER — Other Ambulatory Visit: Payer: Self-pay

## 2015-11-11 ENCOUNTER — Encounter: Payer: Self-pay | Admitting: Endocrinology

## 2015-11-11 MED ORDER — GLUCOSE BLOOD VI STRP: ORAL_STRIP | Status: DC

## 2015-11-12 ENCOUNTER — Encounter: Payer: Self-pay | Admitting: Endocrinology

## 2015-11-12 NOTE — Telephone Encounter (Signed)
I contacted BJ the pt's niece about the my chart message via a telephone call. I advised her I looked into Dr. Cordelia Pen and Einar Pheasant Martin's notes and could not located any documentation that stated the pt should be received 15 units of the Basaglar BID. Pt requested the Pt instructions from the previous visit to be faxed the facility. Instructions with insulin dosage of 15 units QD has been printed and faxed the facility. I instructed the pt's niece to have the facility call if they have any further questions. She voiced understanding.

## 2015-11-13 ENCOUNTER — Encounter (HOSPITAL_BASED_OUTPATIENT_CLINIC_OR_DEPARTMENT_OTHER): Payer: Self-pay

## 2015-11-13 ENCOUNTER — Ambulatory Visit (HOSPITAL_BASED_OUTPATIENT_CLINIC_OR_DEPARTMENT_OTHER)
Admission: RE | Admit: 2015-11-13 | Discharge: 2015-11-13 | Disposition: A | Discharge status: Home / Self Care | Payer: Medicare Other | Source: Ambulatory Visit | Attending: Physician Assistant | Admitting: Physician Assistant

## 2015-11-13 DIAGNOSIS — C787 Secondary malignant neoplasm of liver and intrahepatic bile duct: Secondary | ICD-10-CM | POA: Diagnosis not present

## 2015-11-13 DIAGNOSIS — K769 Liver disease, unspecified: Secondary | ICD-10-CM

## 2015-11-13 DIAGNOSIS — K7689 Other specified diseases of liver: Secondary | ICD-10-CM | POA: Diagnosis present

## 2015-11-13 DIAGNOSIS — C259 Malignant neoplasm of pancreas, unspecified: Secondary | ICD-10-CM | POA: Insufficient documentation

## 2015-11-13 DIAGNOSIS — C786 Secondary malignant neoplasm of retroperitoneum and peritoneum: Secondary | ICD-10-CM | POA: Insufficient documentation

## 2015-11-13 MED ORDER — IOPAMIDOL (ISOVUE-300) INJECTION 61%
100.0000 mL | Freq: Once | INTRAVENOUS | Status: AC | PRN
  Administered 2015-11-13: 100 mL via INTRAVENOUS

## 2015-11-16 ENCOUNTER — Telehealth: Payer: Self-pay | Admitting: *Deleted

## 2015-11-16 ENCOUNTER — Telehealth: Payer: Self-pay | Admitting: Physician Assistant

## 2015-11-16 DIAGNOSIS — C259 Malignant neoplasm of pancreas, unspecified: Secondary | ICD-10-CM

## 2015-11-16 DIAGNOSIS — C787 Secondary malignant neoplasm of liver and intrahepatic bile duct: Secondary | ICD-10-CM

## 2015-11-16 NOTE — Telephone Encounter (Signed)
Spoke with patient and caregiver concerning CT results. CT scan revealed pancreatic adenocarcinoma with multiple peritoneal metastases. Discussed Oncology assessment versus Palliative Consult giving these findings mixed with advanced age, cognitive impairments and comorbid conditions. They would like referral to Palliative Care to discuss comfort measures. Decline referral to Oncology. Will place referral to Palliative Care.

## 2015-11-16 NOTE — Telephone Encounter (Signed)
Call from Opal Sidles w/ [facility] w/ pt's CT abd pelvis results from 11/13/15: pancreatic adenocarcinoma w/ liver and peritoneal mets.   Elyn Aquas, PA-C aware of results.

## 2015-11-17 ENCOUNTER — Other Ambulatory Visit: Payer: Self-pay | Admitting: Physician Assistant

## 2015-11-17 ENCOUNTER — Encounter: Payer: Self-pay | Admitting: Physician Assistant

## 2015-11-17 ENCOUNTER — Ambulatory Visit: Payer: Medicare Other | Admitting: Physician Assistant

## 2015-11-17 DIAGNOSIS — C259 Malignant neoplasm of pancreas, unspecified: Secondary | ICD-10-CM

## 2015-11-19 ENCOUNTER — Ambulatory Visit: Payer: Medicare Other | Admitting: Physician Assistant

## 2015-11-20 ENCOUNTER — Telehealth: Payer: Self-pay | Admitting: Physician Assistant

## 2015-11-20 NOTE — Telephone Encounter (Signed)
Caller name: Adela Glimpse with [facility] Can be reached: [phone]   Reason for call: Reporting low BP on pt. Upon entering pts room BP was 94/58. Pt drank a couple cups of water and BP was 86/50 both at rest. Pt stated she feels awful. Juliann Pulse noted pt is not a complainer. Pt has been seeing endo for blood sugar which is regulated now.

## 2015-11-23 ENCOUNTER — Other Ambulatory Visit: Payer: Self-pay | Admitting: Physician Assistant

## 2015-11-23 ENCOUNTER — Inpatient Hospital Stay (HOSPITAL_COMMUNITY)
Admission: EM | Admit: 2015-11-23 | Discharge: 2015-11-25 | DRG: 535 | Disposition: A | Discharge status: Hospice | Payer: Medicare Other | Attending: Internal Medicine | Admitting: Internal Medicine

## 2015-11-23 ENCOUNTER — Emergency Department (HOSPITAL_COMMUNITY): Payer: Medicare Other

## 2015-11-23 ENCOUNTER — Encounter: Payer: Self-pay | Admitting: *Deleted

## 2015-11-23 ENCOUNTER — Encounter: Payer: Self-pay | Admitting: Physician Assistant

## 2015-11-23 DIAGNOSIS — Z79899 Other long term (current) drug therapy: Secondary | ICD-10-CM

## 2015-11-23 DIAGNOSIS — G8929 Other chronic pain: Secondary | ICD-10-CM | POA: Diagnosis present

## 2015-11-23 DIAGNOSIS — F0393 Unspecified dementia, unspecified severity, with mood disturbance: Secondary | ICD-10-CM | POA: Diagnosis present

## 2015-11-23 DIAGNOSIS — Z823 Family history of stroke: Secondary | ICD-10-CM

## 2015-11-23 DIAGNOSIS — Z66 Do not resuscitate: Secondary | ICD-10-CM | POA: Diagnosis present

## 2015-11-23 DIAGNOSIS — F329 Major depressive disorder, single episode, unspecified: Secondary | ICD-10-CM | POA: Diagnosis present

## 2015-11-23 DIAGNOSIS — C787 Secondary malignant neoplasm of liver and intrahepatic bile duct: Secondary | ICD-10-CM | POA: Diagnosis present

## 2015-11-23 DIAGNOSIS — I10 Essential (primary) hypertension: Secondary | ICD-10-CM | POA: Diagnosis present

## 2015-11-23 DIAGNOSIS — F0390 Unspecified dementia without behavioral disturbance: Secondary | ICD-10-CM | POA: Diagnosis present

## 2015-11-23 DIAGNOSIS — E871 Hypo-osmolality and hyponatremia: Secondary | ICD-10-CM | POA: Diagnosis present

## 2015-11-23 DIAGNOSIS — K219 Gastro-esophageal reflux disease without esophagitis: Secondary | ICD-10-CM | POA: Diagnosis present

## 2015-11-23 DIAGNOSIS — Z8249 Family history of ischemic heart disease and other diseases of the circulatory system: Secondary | ICD-10-CM

## 2015-11-23 DIAGNOSIS — C786 Secondary malignant neoplasm of retroperitoneum and peritoneum: Secondary | ICD-10-CM | POA: Diagnosis present

## 2015-11-23 DIAGNOSIS — Z794 Long term (current) use of insulin: Secondary | ICD-10-CM

## 2015-11-23 DIAGNOSIS — R160 Hepatomegaly, not elsewhere classified: Secondary | ICD-10-CM | POA: Diagnosis present

## 2015-11-23 DIAGNOSIS — I2699 Other pulmonary embolism without acute cor pulmonale: Secondary | ICD-10-CM | POA: Diagnosis present

## 2015-11-23 DIAGNOSIS — R0902 Hypoxemia: Secondary | ICD-10-CM | POA: Diagnosis present

## 2015-11-23 DIAGNOSIS — S72001A Fracture of unspecified part of neck of right femur, initial encounter for closed fracture: Secondary | ICD-10-CM | POA: Diagnosis not present

## 2015-11-23 DIAGNOSIS — Z515 Encounter for palliative care: Secondary | ICD-10-CM | POA: Diagnosis present

## 2015-11-23 DIAGNOSIS — Z7982 Long term (current) use of aspirin: Secondary | ICD-10-CM

## 2015-11-23 DIAGNOSIS — M25551 Pain in right hip: Secondary | ICD-10-CM | POA: Diagnosis not present

## 2015-11-23 DIAGNOSIS — D649 Anemia, unspecified: Secondary | ICD-10-CM | POA: Diagnosis present

## 2015-11-23 DIAGNOSIS — C259 Malignant neoplasm of pancreas, unspecified: Secondary | ICD-10-CM

## 2015-11-23 DIAGNOSIS — D638 Anemia in other chronic diseases classified elsewhere: Secondary | ICD-10-CM | POA: Diagnosis present

## 2015-11-23 DIAGNOSIS — Z833 Family history of diabetes mellitus: Secondary | ICD-10-CM

## 2015-11-23 DIAGNOSIS — R000 Tachycardia, unspecified: Secondary | ICD-10-CM | POA: Diagnosis present

## 2015-11-23 DIAGNOSIS — K8689 Other specified diseases of pancreas: Secondary | ICD-10-CM | POA: Diagnosis present

## 2015-11-23 DIAGNOSIS — W19XXXA Unspecified fall, initial encounter: Secondary | ICD-10-CM | POA: Diagnosis present

## 2015-11-23 DIAGNOSIS — M170 Bilateral primary osteoarthritis of knee: Secondary | ICD-10-CM | POA: Diagnosis present

## 2015-11-23 DIAGNOSIS — Y92129 Unspecified place in nursing home as the place of occurrence of the external cause: Secondary | ICD-10-CM

## 2015-11-23 DIAGNOSIS — E119 Type 2 diabetes mellitus without complications: Secondary | ICD-10-CM | POA: Diagnosis present

## 2015-11-23 DIAGNOSIS — Z803 Family history of malignant neoplasm of breast: Secondary | ICD-10-CM

## 2015-11-23 DIAGNOSIS — E222 Syndrome of inappropriate secretion of antidiuretic hormone: Secondary | ICD-10-CM | POA: Diagnosis present

## 2015-11-23 NOTE — ED Notes (Addendum)
Pt is from [facility] senior [facility]. [facility] tonight and staff put her back in bed but is c/o R pelvic pain. 162mcg fentanyl given en route. RAC 20g. Alert and oriented per norm. Family states increase in falls recently after UTI diagnosis and possible hypotension.

## 2015-11-23 NOTE — ED Notes (Signed)
Niece at bedside

## 2015-11-23 NOTE — Telephone Encounter (Signed)
Message about the same was sent to provider via [place].  Provider is aware.  Pt has an appt with Elyn Aquas, PA-C on 11/25/15 @ 10 am.

## 2015-11-23 NOTE — ED Notes (Signed)
Bed: WA20 Expected date:  Expected time:  Means of arrival:  Comments: EMS 82yo F fall / rt hip / pelvis pain

## 2015-11-24 ENCOUNTER — Encounter (HOSPITAL_COMMUNITY): Payer: Self-pay

## 2015-11-24 ENCOUNTER — Emergency Department (HOSPITAL_COMMUNITY): Payer: Medicare Other

## 2015-11-24 ENCOUNTER — Encounter: Payer: Self-pay | Admitting: Physician Assistant

## 2015-11-24 DIAGNOSIS — Z515 Encounter for palliative care: Secondary | ICD-10-CM

## 2015-11-24 DIAGNOSIS — I2699 Other pulmonary embolism without acute cor pulmonale: Secondary | ICD-10-CM

## 2015-11-24 DIAGNOSIS — Z823 Family history of stroke: Secondary | ICD-10-CM | POA: Diagnosis not present

## 2015-11-24 DIAGNOSIS — D649 Anemia, unspecified: Secondary | ICD-10-CM | POA: Diagnosis present

## 2015-11-24 DIAGNOSIS — Z79899 Other long term (current) drug therapy: Secondary | ICD-10-CM | POA: Diagnosis not present

## 2015-11-24 DIAGNOSIS — Z7982 Long term (current) use of aspirin: Secondary | ICD-10-CM | POA: Diagnosis not present

## 2015-11-24 DIAGNOSIS — Z794 Long term (current) use of insulin: Secondary | ICD-10-CM | POA: Diagnosis not present

## 2015-11-24 DIAGNOSIS — Z7189 Other specified counseling: Secondary | ICD-10-CM | POA: Diagnosis not present

## 2015-11-24 DIAGNOSIS — S72001A Fracture of unspecified part of neck of right femur, initial encounter for closed fracture: Secondary | ICD-10-CM | POA: Diagnosis present

## 2015-11-24 DIAGNOSIS — Z66 Do not resuscitate: Secondary | ICD-10-CM | POA: Diagnosis present

## 2015-11-24 DIAGNOSIS — E119 Type 2 diabetes mellitus without complications: Secondary | ICD-10-CM | POA: Diagnosis present

## 2015-11-24 DIAGNOSIS — Z8249 Family history of ischemic heart disease and other diseases of the circulatory system: Secondary | ICD-10-CM | POA: Diagnosis not present

## 2015-11-24 DIAGNOSIS — E222 Syndrome of inappropriate secretion of antidiuretic hormone: Secondary | ICD-10-CM | POA: Diagnosis present

## 2015-11-24 DIAGNOSIS — R0902 Hypoxemia: Secondary | ICD-10-CM | POA: Diagnosis present

## 2015-11-24 DIAGNOSIS — I10 Essential (primary) hypertension: Secondary | ICD-10-CM | POA: Diagnosis present

## 2015-11-24 DIAGNOSIS — C786 Secondary malignant neoplasm of retroperitoneum and peritoneum: Secondary | ICD-10-CM | POA: Diagnosis present

## 2015-11-24 DIAGNOSIS — D638 Anemia in other chronic diseases classified elsewhere: Secondary | ICD-10-CM

## 2015-11-24 DIAGNOSIS — Z833 Family history of diabetes mellitus: Secondary | ICD-10-CM | POA: Diagnosis not present

## 2015-11-24 DIAGNOSIS — K869 Disease of pancreas, unspecified: Secondary | ICD-10-CM

## 2015-11-24 DIAGNOSIS — M170 Bilateral primary osteoarthritis of knee: Secondary | ICD-10-CM | POA: Diagnosis present

## 2015-11-24 DIAGNOSIS — F329 Major depressive disorder, single episode, unspecified: Secondary | ICD-10-CM | POA: Diagnosis present

## 2015-11-24 DIAGNOSIS — F0390 Unspecified dementia without behavioral disturbance: Secondary | ICD-10-CM

## 2015-11-24 DIAGNOSIS — G8929 Other chronic pain: Secondary | ICD-10-CM | POA: Diagnosis present

## 2015-11-24 DIAGNOSIS — Y92129 Unspecified place in nursing home as the place of occurrence of the external cause: Secondary | ICD-10-CM | POA: Diagnosis not present

## 2015-11-24 DIAGNOSIS — E1142 Type 2 diabetes mellitus with diabetic polyneuropathy: Secondary | ICD-10-CM

## 2015-11-24 DIAGNOSIS — C787 Secondary malignant neoplasm of liver and intrahepatic bile duct: Secondary | ICD-10-CM | POA: Diagnosis present

## 2015-11-24 DIAGNOSIS — M25551 Pain in right hip: Secondary | ICD-10-CM | POA: Diagnosis present

## 2015-11-24 DIAGNOSIS — C259 Malignant neoplasm of pancreas, unspecified: Secondary | ICD-10-CM | POA: Diagnosis present

## 2015-11-24 DIAGNOSIS — E871 Hypo-osmolality and hyponatremia: Secondary | ICD-10-CM | POA: Diagnosis present

## 2015-11-24 DIAGNOSIS — W19XXXA Unspecified fall, initial encounter: Secondary | ICD-10-CM | POA: Diagnosis present

## 2015-11-24 DIAGNOSIS — R000 Tachycardia, unspecified: Secondary | ICD-10-CM | POA: Diagnosis present

## 2015-11-24 DIAGNOSIS — R160 Hepatomegaly, not elsewhere classified: Secondary | ICD-10-CM | POA: Diagnosis present

## 2015-11-24 DIAGNOSIS — K219 Gastro-esophageal reflux disease without esophagitis: Secondary | ICD-10-CM | POA: Diagnosis present

## 2015-11-24 DIAGNOSIS — Z803 Family history of malignant neoplasm of breast: Secondary | ICD-10-CM | POA: Diagnosis not present

## 2015-11-24 DIAGNOSIS — S72001D Fracture of unspecified part of neck of right femur, subsequent encounter for closed fracture with routine healing: Secondary | ICD-10-CM | POA: Diagnosis not present

## 2015-11-24 DIAGNOSIS — K8689 Other specified diseases of pancreas: Secondary | ICD-10-CM | POA: Diagnosis present

## 2015-11-24 LAB — CBC WITH DIFFERENTIAL/PLATELET
Basophils Absolute: 0 10*3/uL (ref 0.0–0.1)
Basophils Relative: 0 %
EOS PCT: 0 %
Eosinophils Absolute: 0 10*3/uL (ref 0.0–0.7)
HCT: 29.1 % — ABNORMAL LOW (ref 36.0–46.0)
Hemoglobin: 9.5 g/dL — ABNORMAL LOW (ref 12.0–15.0)
LYMPHS ABS: 0.7 10*3/uL (ref 0.7–4.0)
LYMPHS PCT: 7 %
MCH: 28.4 pg (ref 26.0–34.0)
MCHC: 32.6 g/dL (ref 30.0–36.0)
MCV: 87.1 fL (ref 78.0–100.0)
MONO ABS: 0.8 10*3/uL (ref 0.1–1.0)
MONOS PCT: 9 %
Neutro Abs: 7.3 10*3/uL (ref 1.7–7.7)
Neutrophils Relative %: 84 %
Platelets: 248 10*3/uL (ref 150–400)
RBC: 3.34 MIL/uL — AB (ref 3.87–5.11)
RDW: 14.2 % (ref 11.5–15.5)
WBC: 8.8 10*3/uL (ref 4.0–10.5)

## 2015-11-24 LAB — GLUCOSE, CAPILLARY
GLUCOSE-CAPILLARY: 137 mg/dL — AB (ref 65–99)
GLUCOSE-CAPILLARY: 98 mg/dL (ref 65–99)
GLUCOSE-CAPILLARY: 138 mg/dL — AB (ref 65–99)
Glucose-Capillary: 69 mg/dL (ref 65–99)
Glucose-Capillary: 65 mg/dL (ref 65–99)

## 2015-11-24 LAB — URINALYSIS, ROUTINE W REFLEX MICROSCOPIC
BILIRUBIN URINE: NEGATIVE
GLUCOSE, UA: NEGATIVE mg/dL
Hgb urine dipstick: NEGATIVE
KETONES UR: NEGATIVE mg/dL
Leukocytes, UA: NEGATIVE
Nitrite: NEGATIVE
PH: 7 (ref 5.0–8.0)
Protein, ur: NEGATIVE mg/dL
Specific Gravity, Urine: 1.016 (ref 1.005–1.030)

## 2015-11-24 LAB — BASIC METABOLIC PANEL
ANION GAP: 10 (ref 5–15)
Anion gap: 4 — ABNORMAL LOW (ref 5–15)
BUN: 10 mg/dL (ref 6–20)
BUN: 8 mg/dL (ref 6–20)
CALCIUM: 8.4 mg/dL — AB (ref 8.9–10.3)
CALCIUM: 8.9 mg/dL (ref 8.9–10.3)
CHLORIDE: 97 mmol/L — AB (ref 101–111)
CO2: 23 mmol/L (ref 22–32)
CO2: 25 mmol/L (ref 22–32)
CREATININE: 0.41 mg/dL — AB (ref 0.44–1.00)
CREATININE: 0.42 mg/dL — AB (ref 0.44–1.00)
Chloride: 97 mmol/L — ABNORMAL LOW (ref 101–111)
GFR calc Af Amer: >60 mL/min (ref >60)
GFR calc non Af Amer: >60 mL/min (ref >60)
GFR calc non Af Amer: >60 mL/min (ref >60)
GLUCOSE: 176 mg/dL — AB (ref 65–99)
Glucose, Bld: 150 mg/dL — ABNORMAL HIGH (ref 65–99)
Potassium: 3.7 mmol/L (ref 3.5–5.1)
Potassium: 4.1 mmol/L (ref 3.5–5.1)
SODIUM: 130 mmol/L — AB (ref 135–145)
Sodium: 126 mmol/L — ABNORMAL LOW (ref 135–145)

## 2015-11-24 LAB — HEPARIN LEVEL (UNFRACTIONATED)
Heparin Unfractionated: 0.35 IU/mL (ref 0.30–0.70)
Heparin Unfractionated: <0.1 IU/mL — ABNORMAL LOW (ref 0.30–0.70)

## 2015-11-24 LAB — PROTIME-INR
INR: 1.17 (ref 0.00–1.49)
Prothrombin Time: 15.1 seconds (ref 11.6–15.2)

## 2015-11-24 LAB — OSMOLALITY: Osmolality: 267 mOsm/kg — ABNORMAL LOW (ref 275–295)

## 2015-11-24 LAB — OSMOLALITY, URINE: OSMOLALITY UR: 453 mosm/kg (ref 300–900)

## 2015-11-24 LAB — TROPONIN I

## 2015-11-24 LAB — MRSA PCR SCREENING: MRSA BY PCR: NEGATIVE

## 2015-11-24 MED ORDER — HEPARIN BOLUS VIA INFUSION
2500.0000 [IU] | Freq: Once | INTRAVENOUS | Status: AC
  Administered 2015-11-24: 2500 [IU] via INTRAVENOUS
  Filled 2015-11-24: qty 2500

## 2015-11-24 MED ORDER — INSULIN ASPART 100 UNIT/ML ~~LOC~~ SOLN
0.0000 [IU] | Freq: Three times a day (TID) | SUBCUTANEOUS | Status: DC
  Administered 2015-11-24 – 2015-11-25 (×2): 1 [IU] via SUBCUTANEOUS
  Administered 2015-11-25: 2 [IU] via SUBCUTANEOUS

## 2015-11-24 MED ORDER — MORPHINE SULFATE (PF) 2 MG/ML IV SOLN
0.5000 mg | INTRAVENOUS | Status: DC | PRN
  Administered 2015-11-24 (×2): 0.5 mg via INTRAVENOUS
  Filled 2015-11-24 (×2): qty 1

## 2015-11-24 MED ORDER — INSULIN ASPART 100 UNIT/ML ~~LOC~~ SOLN: 0.0000 [IU] | Freq: Every day | SUBCUTANEOUS | Status: DC

## 2015-11-24 MED ORDER — FENTANYL CITRATE (PF) 100 MCG/2ML IJ SOLN
50.0000 ug | INTRAMUSCULAR | Status: DC | PRN
  Filled 2015-11-24: qty 2

## 2015-11-24 MED ORDER — SODIUM CHLORIDE 0.9 % IV BOLUS (SEPSIS)
1000.0000 mL | Freq: Once | INTRAVENOUS | Status: AC
  Administered 2015-11-24: 1000 mL via INTRAVENOUS

## 2015-11-24 MED ORDER — CETYLPYRIDINIUM CHLORIDE 0.05 % MT LIQD
7.0000 mL | Freq: Two times a day (BID) | OROMUCOSAL | Status: DC
  Administered 2015-11-25 (×2): 7 mL via OROMUCOSAL

## 2015-11-24 MED ORDER — MORPHINE SULFATE (PF) 4 MG/ML IV SOLN
4.0000 mg | Freq: Once | INTRAVENOUS | Status: AC
  Administered 2015-11-24: 4 mg via INTRAVENOUS
  Filled 2015-11-24: qty 1

## 2015-11-24 MED ORDER — METOPROLOL SUCCINATE ER 25 MG PO TB24
12.5000 mg | ORAL_TABLET | Freq: Every day | ORAL | Status: DC
  Administered 2015-11-24 – 2015-11-25 (×2): 12.5 mg via ORAL
  Filled 2015-11-24 (×2): qty 1

## 2015-11-24 MED ORDER — SERTRALINE HCL 25 MG PO TABS
12.5000 mg | ORAL_TABLET | Freq: Every day | ORAL | Status: DC
  Administered 2015-11-24 – 2015-11-25 (×2): 12.5 mg via ORAL
  Filled 2015-11-24 (×2): qty 0.5

## 2015-11-24 MED ORDER — FLUTICASONE PROPIONATE 50 MCG/ACT NA SUSP
1.0000 | Freq: Every day | NASAL | Status: DC
  Administered 2015-11-24: 1 via NASAL
  Filled 2015-11-24: qty 16

## 2015-11-24 MED ORDER — IOPAMIDOL (ISOVUE-370) INJECTION 76%
100.0000 mL | Freq: Once | INTRAVENOUS | Status: AC | PRN
  Administered 2015-11-24: 80 mL via INTRAVENOUS

## 2015-11-24 MED ORDER — INSULIN GLARGINE 100 UNIT/ML ~~LOC~~ SOLN
15.0000 [IU] | Freq: Every day | SUBCUTANEOUS | Status: DC
  Administered 2015-11-25: 15 [IU] via SUBCUTANEOUS
  Filled 2015-11-24 (×2): qty 0.15

## 2015-11-24 MED ORDER — FAMOTIDINE 20 MG PO TABS
20.0000 mg | ORAL_TABLET | Freq: Two times a day (BID) | ORAL | Status: DC
  Administered 2015-11-24 – 2015-11-25 (×3): 20 mg via ORAL
  Filled 2015-11-24 (×4): qty 1

## 2015-11-24 MED ORDER — HYDROCODONE-ACETAMINOPHEN 5-325 MG PO TABS
1.0000 | ORAL_TABLET | Freq: Four times a day (QID) | ORAL | Status: DC | PRN
  Administered 2015-11-25: 1 via ORAL
  Filled 2015-11-24: qty 1

## 2015-11-24 MED ORDER — LOSARTAN POTASSIUM 25 MG PO TABS
25.0000 mg | ORAL_TABLET | Freq: Every day | ORAL | Status: DC
  Administered 2015-11-24 – 2015-11-25 (×2): 25 mg via ORAL
  Filled 2015-11-24 (×2): qty 1

## 2015-11-24 MED ORDER — DONEPEZIL HCL 10 MG PO TABS
10.0000 mg | ORAL_TABLET | Freq: Every day | ORAL | Status: DC
  Administered 2015-11-24: 10 mg via ORAL
  Filled 2015-11-24 (×2): qty 1

## 2015-11-24 MED ORDER — HEPARIN (PORCINE) IN NACL 100-0.45 UNIT/ML-% IJ SOLN
850.0000 [IU]/h | INTRAMUSCULAR | Status: DC
  Administered 2015-11-24 (×2): 800 [IU]/h via INTRAVENOUS
  Administered 2015-11-25: 850 [IU]/h via INTRAVENOUS
  Filled 2015-11-24 (×3): qty 250

## 2015-11-24 MED ORDER — POLYETHYLENE GLYCOL 3350 17 G PO PACK
17.0000 g | PACK | Freq: Every day | ORAL | Status: DC
  Administered 2015-11-24: 17 g via ORAL
  Filled 2015-11-24 (×2): qty 1

## 2015-11-24 MED ORDER — SENNOSIDES-DOCUSATE SODIUM 8.6-50 MG PO TABS: 1.0000 | ORAL_TABLET | Freq: Every evening | ORAL | Status: DC | PRN

## 2015-11-24 MED ORDER — CHLORHEXIDINE GLUCONATE 0.12 % MT SOLN
15.0000 mL | Freq: Two times a day (BID) | OROMUCOSAL | Status: DC
  Administered 2015-11-24 – 2015-11-25 (×2): 15 mL via OROMUCOSAL
  Filled 2015-11-24 (×4): qty 15

## 2015-11-24 MED ORDER — BASAGLAR KWIKPEN 100 UNIT/ML ~~LOC~~ SOPN: 15.0000 [IU] | PEN_INJECTOR | Freq: Every day | SUBCUTANEOUS | Status: DC

## 2015-11-24 NOTE — Consult Note (Addendum)
Melrose Nakayama, MD  Chauncey Cruel, PA-C  Loni Dolly, PA-C                                  [facility]                [address], [place], [place]  [address]   ORTHOPAEDIC CONSULTATION  Jakyla Mcpike            MRN:  [id number] DOB/SEX:  [birth year]/female     CHIEF COMPLAINT:  Painful hip  HISTORY: Christine Copeland a 82 y.o. female with some baseline confusion and recently diagnosed pancreatic cancer who came in last night with a broken hip.  Poor historian but apparently marginal ambulater before this. Lives in a supportive care center currently.   PAST MEDICAL HISTORY: Patient Active Problem List   Diagnosis Date Noted  . Pulmonary embolism ([place]) 11/24/2015  . Displaced fracture of right femoral neck ([place]) 11/24/2015  . Hyponatremia 11/24/2015  . Anemia, chronic disease 11/24/2015  . Pancreatic mass 11/24/2015  . Acute cystitis without hematuria 11/08/2015  . Elevated alkaline phosphatase level 10/27/2015  . Gastroesophageal reflux disease without esophagitis 06/14/2015  . Hiatal hernia 06/14/2015  . Diabetes ([place]) 06/14/2015  . Essential hypertension, benign 06/14/2015  . Dementia, senile with depression 06/14/2015  . Primary osteoarthritis of both knees 06/14/2015   Past Medical History  Diagnosis Date  . Diabetes ([place])   . GERD (gastroesophageal reflux disease)   . Idiopathic small intestinal ulcers   . Seasonal allergies   . Hypertension   . Hiatal hernia   . Degenerative arthritis of knee, bilateral    Past Surgical History  Procedure Laterality Date  . Dilation and curettage of uterus      x3  . Knee arthroscopy      left  . Wisdom tooth extraction       MEDICATIONS:   Current facility-administered medications:  .  antiseptic oral rinse (CPC / CETYLPYRIDINIUM CHLORIDE 0.05%) solution 7 mL, 7 mL, Mouth Rinse, q12n4p, Sharlett Iles, MD, 7 mL at 11/24/15 1247 .  chlorhexidine (PERIDEX) 0.12 % solution 15 mL, 15 mL, Mouth Rinse, BID,  Sharlett Iles, MD, 15 mL at 11/24/15 0946 .  donepezil (ARICEPT) tablet 10 mg, 10 mg, Oral, QHS, Edwin Dada, MD .  famotidine (PEPCID) tablet 20 mg, 20 mg, Oral, BID, Edwin Dada, MD, 20 mg at 11/24/15 0945 .  fluticasone (FLONASE) 50 MCG/ACT nasal spray 1 spray, 1 spray, Each Nare, QHS, Edwin Dada, MD .  [COMPLETED] heparin bolus via infusion 2,500 Units, 2,500 Units, Intravenous, Once, 2,500 Units at 11/24/15 0328 **FOLLOWED BY** heparin ADULT infusion 100 units/mL (25000 units/250 mL), 800 Units/hr, Intravenous, Continuous, Leann T Poindexter, RPH, Last Rate: 8 mL/hr at 11/24/15 0328, 800 Units/hr at 11/24/15 0328 .  HYDROcodone-acetaminophen (NORCO/VICODIN) 5-325 MG per tablet 1-2 tablet, 1-2 tablet, Oral, Q6H PRN, Edwin Dada, MD .  insulin aspart (novoLOG) injection 0-5 Units, 0-5 Units, Subcutaneous, QHS, Christopher P Danford, MD .  insulin aspart (novoLOG) injection 0-9 Units, 0-9 Units, Subcutaneous, TID WC, Edwin Dada, MD, 1 Units at 11/24/15 0801 .  insulin glargine (LANTUS) injection 15 Units, 15 Units, Subcutaneous, Q1200, Edwin Dada, MD, 15 Units at 11/24/15 1246 .  losartan (COZAAR) tablet 25 mg, 25 mg, Oral, Daily, Edwin Dada, MD, 25 mg at 11/24/15 0945 .  metoprolol succinate (TOPROL-XL) 24 hr tablet 12.5  mg, 12.5 mg, Oral, Daily, Edwin Dada, MD, 12.5 mg at 11/24/15 0945 .  morphine 2 MG/ML injection 0.5 mg, 0.5 mg, Intravenous, Q2H PRN, Edwin Dada, MD, 0.5 mg at 11/24/15 0527 .  polyethylene glycol (MIRALAX / GLYCOLAX) packet 17 g, 17 g, Oral, QHS, Edwin Dada, MD .  senna-docusate (Senokot-S) tablet 1 tablet, 1 tablet, Oral, QHS PRN, Edwin Dada, MD .  sertraline (ZOLOFT) tablet 12.5 mg, 12.5 mg, Oral, Daily, Edwin Dada, MD, 12.5 mg at 11/24/15 0945  ALLERGIES:  No Known Allergies  REVIEW OF SYSTEMS: REVIEWED IN DETAIL IN CHART  FAMILY HISTORY:    Family History  Problem Relation Age of Onset  . Hypertension Mother   . Prostate cancer Father   . Diabetes Mother   . Diabetes Maternal Grandfather   . Tuberculosis Paternal Uncle   . Diabetes Sister     x3  . Stroke Sister     x2  . Arthritis Sister     x2  . Breast cancer Sister     x2  . Hypertension Sister     x5    SOCIAL HISTORY:   Social History  Substance Use Topics  . Smoking status: Never Smoker   . Smokeless tobacco: Never Used  . Alcohol Use: Not on file     EXAMINATION: Vital signs in last 24 hours: Temp:  [97.9 F (36.6 C)-98.8 F (37.1 C)] 98.8 F (37.1 C) (04/25 1400) Pulse Rate:  [93-103] 103 (04/25 1400) Resp:  [13-22] 18 (04/25 1400) BP: (115-150)/(54-75) 120/66 mmHg (04/25 1400) SpO2:  [94 %-100 %] 95 % (04/25 1400) Weight:  [111 lb 5.3 oz (50.5 kg)-114 lb 3.2 oz (51.8 kg)] 114 lb 3.2 oz (51.8 kg) (04/25 0507)  BP 120/66 mmHg  Pulse 103  Temp(Src) 98.8 F (37.1 C) (Oral)  Resp 18  Ht 4\' 11"  (1.499 m)  Wt 114 lb 3.2 oz (51.8 kg)  BMI 23.05 kg/m2  SpO2 95%  General Appearance:    Somnelent, cooperative, no distress, appears stated age  Head:    Normocephalic, without obvious abnormality, atraumatic  Eyes:    PERRL, conjunctiva/corneas clear, EOM's intact, fundi    benign, both eyes  Ears:    Normal TM's and external ear canals, both ears  Nose:   Nares normal, septum midline, mucosa normal, no drainage    or sinus tenderness  Throat:   Lips, mucosa, and tongue normal  Neck:   Supple, symmetrical, trachea midline, no adenopathy;    thyroid:  no enlargement/tenderness/nodules; no carotid   bruit or JVD  Back:     Symmetric, no curvature, ROM normal, no CVA tenderness  Lungs:     Clear to auscultation bilaterally, respirations unlabored  Chest Wall:    No tenderness or deformity   Heart:    Regular rate and rhythm, S1 and S2 normal, no murmur, rub   or gallop  Breast Exam:    No tenderness, masses, or nipple abnormality  Abdomen:      Soft, non-tender, bowel sounds active all four quadrants,    no masses, no organomegaly  Genitalia:    Rectal:    Extremities:   Other extremities normal, atraumatic, no cyanosis or edema  Pulses:   2+ and symmetric all extremities  Skin:   Skin color, texture, turgor normal, no rashes or lesions  Lymph nodes:   Cervical, supraclavicular, and axillary nodes normal  Neurologic:   CNII-XII intact, normal strength, sensation and reflexes  throughout    Musculoskeletal Exam:   Left leg SER and painful to motion, skin OK   DIAGNOSTIC STUDIES: Recent laboratory studies:  Recent Labs  11/24/15 0015  WBC 8.8  HGB 9.5*  HCT 29.1*  PLT 248    Recent Labs  11/24/15 0015 11/24/15 0518  NA 126* 130*  K 4.1 3.7  CL 97* 97*  CO2 25 23  BUN 10 8  CREATININE 0.42* 0.41*  GLUCOSE 176* 150*  CALCIUM 8.9 8.4*   Lab Results  Component Value Date   INR 1.17 11/24/2015     Recent Radiographic Studies :  Dg Chest 1 View  11/24/2015  CLINICAL DATA:  Preop.  Fractured left hip EXAM: CHEST 1 VIEW COMPARISON:  None. FINDINGS: Study limited by leftward rotation and chin overlapping the left apex. Patchy streaky bilateral lung opacity at the bases and in the right upper lobe. Lung volumes are low. No cardiomegaly when accounting for leftward rotation. No edema, effusion, or pneumothorax. Osteopenia. IMPRESSION: Bilateral atelectasis or aspiration, greatest in the retrocardiac lung. Electronically Signed   By: Monte Fantasia M.D.   On: 11/24/2015 01:09   Ct Angio Chest Pe W/cm &/or Wo Cm  11/24/2015  CLINICAL DATA:  Status post fall, with hypoxia and generalized chest soreness. Initial encounter. EXAM: CT ANGIOGRAPHY CHEST WITH CONTRAST TECHNIQUE: Multidetector CT imaging of the chest was performed using the standard protocol during bolus administration of intravenous contrast. Multiplanar CT image reconstructions and MIPs were obtained to evaluate the vascular anatomy. CONTRAST:  80 mL of  Isovue 370 IV contrast COMPARISON:  Chest radiograph performed earlier today at 12:24 a.m. FINDINGS: Pulmonary embolus is noted within segmental and subsegmental branches to the right upper and lower lung lobes. There is suggestion of a small peripheral pulmonary infarct at the right upper lobe. Trace bilateral pleural effusions are noted. Mild left basilar airspace opacity may reflect atelectasis or possibly mild infection. Mild right basilar atelectasis is noted. No pneumothorax is identified. No masses are identified; no abnormal focal contrast enhancement is seen. The mediastinum is unremarkable in appearance. No mediastinal lymphadenopathy is seen. No pericardial effusion is identified. The great vessels are grossly unremarkable in appearance. No axillary lymphadenopathy is seen. The thyroid gland is unremarkable in appearance. Vague hypodensities within the liver reflect the patient's known hepatic metastases. Trace ascites noted about the liver and spleen. No acute osseous abnormalities are seen. Facet disease is noted at the lower cervical spine. Review of the MIP images confirms the above findings. IMPRESSION: 1. Pulmonary embolus within segmental and subsegmental branches to the right upper and lower lung lobes. 2. Suggestion of small peripheral pulmonary infarct at the right upper lobe. 3. Mild left basilar airspace opacity may reflect atelectasis or possibly mild infection. 4. Trace bilateral pleural effusions, with mild right basilar atelectasis. 5. Vague hypodensities within the liver reflect the patient's known hepatic metastases. 6. Trace ascites noted about the liver and spleen. Critical Value/emergent results were called by telephone at the time of interpretation on 11/24/2015 at 2:58 am to Dr. Theotis Burrow, who verbally acknowledged these results. Electronically Signed   By: Garald Balding M.D.   On: 11/24/2015 03:30   Ct Abdomen Pelvis W Contrast  11/13/2015  CLINICAL DATA:  Abdominal  tenderness and loss of appetite. EXAM: CT ABDOMEN AND PELVIS WITH CONTRAST TECHNIQUE: Multidetector CT imaging of the abdomen and pelvis was performed using the standard protocol following bolus administration of intravenous contrast. CONTRAST:  166mL ISOVUE-300 IOPAMIDOL (ISOVUE-300) INJECTION 61% COMPARISON:  Abdominal ultrasound 11/06/2015 FINDINGS: Lower chest and abdominal wall: Supraumbilical hernia containing fat, omental vessels, and peritoneal carcinomatosis. Hepatobiliary: Numerous ill-defined hypo enhancing masses throughout the liver. The largest is in segment 7 measuring ~3 cm.Cholecystectomy with negative common bile duct Pancreas: Infiltrating hypo enhancing mass in the pancreatic body with extension beyond the parenchymal margins. Cystic change at the tail is dilated main duct. Appearance is consistent with adenocarcinoma of the pancreas. There is wispy density infiltrating towards the celiac ganglia. No definitive tumor seen around the celiac or SMA vessels branches. Liver metastases as described above. There is also peritoneal carcinomatosis with nodularity of the omentum and right upper quadrant with trace perihepatic and pelvic fluid. Peritoneal tumor likely deposit along the lower margin of the sigmoid colon a there is abnormal eccentric soft tissue. Spleen: Unremarkable. Adrenals/Urinary Tract: Negative adrenals. No hydronephrosis or stone. Unremarkable bladder. Reproductive:Negative Stomach/Bowel: No obstruction or inflammation. Incidental non rotation of bowel. Distal colonic diverticulosis. Vascular/Lymphatic: No acute vascular abnormality. Peritoneal: No ascites or pneumoperitoneum. Musculoskeletal: No acute abnormalities. No metastatic lesions seen. Degenerative disease and levoscoliosis. These results will be called to the ordering clinician or representative by the Radiologist Assistant, and communication documented in the PACS or zVision Dashboard. IMPRESSION: Pancreas adenocarcinoma  with liver and peritoneal metastases. Electronically Signed   By: Monte Fantasia M.D.   On: 11/13/2015 12:53   Dg Hip Unilat  With Pelvis 2-3 Views Right  11/23/2015  CLINICAL DATA:  Unwitnessed fall at nursing home with right hip deformity. Initial encounter. EXAM: DG HIP (WITH OR WITHOUT PELVIS) 2-3V RIGHT COMPARISON:  None. FINDINGS: Acute right femoral neck fracture with proximal displacement. No notable degenerative changes to the acetabulum. No evidence of pelvic ring fracture or diastasis. Osteopenia. No metastatic lesion seen in this location on 11/13/2015 CT. IMPRESSION: Displaced right femoral neck fracture. Electronically Signed   By: Monte Fantasia M.D.   On: 11/23/2015 23:58   US Abdomen Limited Ruq  11/06/2015  CLINICAL DATA:  Elevated liver enzymes EXAM: US ABDOMEN LIMITED - RIGHT UPPER QUADRANT COMPARISON:  None. FINDINGS: Gallbladder: Not visualized.  Question absence. Common bile duct: Diameter: 7 mm. No intrahepatic or extrahepatic biliary duct dilatation. Liver: The overall liver echotexture is inhomogeneous. There is a 2.0 x 2.2 x 1.8 cm solid mass with hypoechoic rim in the anterior segment lobe of the liver. In the medial segment left lobe of the liver, there is a 2.1 x 2.1 x 1.9 cm mass with hypoechoic rim. In the posterior segment of the right lobe of the liver, there is a slightly inhomogeneous but predominantly hypoechoic mass. A small amount of ascites is noted adjacent to the liver edge. IMPRESSION: Mass lesions in the liver suspicious for metastatic foci. Small amount of ascites. Gallbladder not visualized. Question absence. No biliary duct dilatation. No biliary duct mass or calculus evident by ultrasound. These findings raise concern for underlying neoplasm, likely metastases in the liver. These results will be called to the ordering clinician or representative by the Radiologist Assistant, and communication documented in the PACS or zVision Dashboard. Electronically Signed    By: Lowella Grip III M.D.   On: 11/06/2015 15:44    ASSESSMENT:  Left femoral neck fracture   PLAN:  Typically manage this injury with hemiarthroplasty to help with pain control and to allow for mobilization.  Apparently hospice is involved and arrangements for supportive care and pain management are being made in light of pancreatic cancer diagnosis. Will plan on nonoperative management for now.  If  circumstances change and she becomes a surgical candidate please reconsult and I would be glad to see her again. Could consider 5-10 pounds of Buck's traction if experiences terrible pain but frequently this is not very painful while at bedrest. Discussed management with patient and grand niece.  Thanh Pomerleau G 11/24/2015, 2:05 PM

## 2015-11-24 NOTE — Consult Note (Signed)
[facility]--Rec'd referral from [place] and was able to meet in person with niece and HCPOA-Bernadette Polux.  Discussed HOP services both at home and at [place] [facility].  While Mliss Sax was originally planning to bring pt home with her, she now seems to be leaning towards a transfer to [facility].  Mliss Sax lives in a 3rd floor apartment and is concerned about her advanced care needs.  We discussed [facility]ospice [facility] services including philosophy of comfort care and team approach to care.  She states she is awaiting other specialist consults today.  Made arrangements to f/u with her again tomorrow to further discuss d/c plans.  Wynetta Fines, RN [phone]

## 2015-11-24 NOTE — ED Notes (Signed)
MD at bedside. 

## 2015-11-24 NOTE — Progress Notes (Addendum)
PROGRESS NOTE  Christine Copeland  [id number] DOB: 1924-09-16  DOA: 11/23/2015 PCP: Leeanne Rio, PA-C  Outpatient Specialists:  None  Brief Narrative:  82 y.o. female with a past medical history significant for dementia, IDDM, recently diagnosed pancreatic mass with apparent liver metastasis who presents with fall and hip pain and chest pain. Patient sustained an unwitnessed fall at ALF and was unable to weight-bear due to severe pain. She was tachycardic and hypoxic in the ED. Imaging confirmed displaced right femoral neck fracture. CTA chest showed segmental and subsegmental right-sided PE without heart strain. She gives approximately 4 months history of epigastric and right-sided abdominal pain, CT abdomen 4/14 confirmed pancreatic mass/suspected adenocarcinoma with liver and peritoneal metastasis. She was referred to outpatient oncology and hospice but not seen yet. Palliative, orthopedics consulted. Discussed with oncology who will review patient's case at tumor board 4/26 and provide input.   Assessment & Plan:   Principal Problem:   Displaced fracture of right femoral neck (HCC) Active Problems:   Diabetes (HCC)   Essential hypertension, benign   Dementia, senile with depression   Pulmonary embolism (HCC)   Hyponatremia   Anemia, chronic disease   Pancreatic mass   Displaced right femoral neck fracture - Sustained post mechanical fall at assisted living facility. - Patient deemed high risk for surgery given acute PE in the context of suspected metastatic pancreatic cancer, advanced age and frail status. - Discussed at length with patient and her grand niece. They will meet with palliative care team for goals of care and are considering nonaggressive/non-surgical treatment and discharge home with hospice. - Orthopedic input appreciated: May consider 5-10 pounds of Buck's traction if experiences terrible pain.  Acute pulmonary embolism - In the context of suspected  metastatic pancreatic cancer - Patient was hypoxic on arrival. - Started empirically on IV heparin drip. - Await palliative care follow-up regarding goals of care  Suspected pancreatic cancer with liver and peritoneal metastasis/chronic abdominal pain. - Patient was referred to oncology and hospice as outpatient but yet to be seen. - Discussed at length with patient's grandniece who indicates that patient would not want biopsy nor chemotherapy. - Requested oncologist on call to review case-she indicated that she will discuss patient at tumor Board on 4/26 and provide input.  Anemia of chronic disease  Hyponatremia - Improving.? SIADH related to malignancy.  IDDM - SSI to continue  Essential hypertension - Controlled  Dementia - Mental status probably at baseline.   DVT prophylaxis: On IV heparin drip anticoagulation. Code Status: DO NOT RESUSCITATE Family Communication: Discussed extensively with patient's grandniece Ms. Bernadette Polux at bedside on 4/25 Disposition Plan: [facility] possibly 4/26 with home hospice   Consultants:   Orthopedics  Palliative care medicine  Procedures:   None  Antimicrobials:   None    Subjective: Chronic abdominal pain and right hip pain-not significantly improved. Denies any other complaints.  Objective:  Filed Vitals:   11/24/15 0322 11/24/15 0507 11/24/15 1006 11/24/15 1400  BP: 135/73 150/64 115/54 120/66  Pulse: 95 97 102 103  Temp:  97.9 F (36.6 C) 98.6 F (37 C) 98.8 F (37.1 C)  TempSrc:  Oral Oral Oral  Resp: 17 22 18 18   Height:  4\' 11"  (1.499 m)    Weight:  51.8 kg (114 lb 3.2 oz)    SpO2: 100% 100% 97% 95%    Intake/Output Summary (Last 24 hours) at 11/24/15 1511 Last data filed at 11/24/15 0426  Gross per 24 hour  Intake  0 ml  Output    700 ml  Net   -700 ml   Filed Weights   11/24/15 0306 11/24/15 0507  Weight: 50.5 kg (111 lb 5.3 oz) 51.8 kg (114 lb 3.2 oz)    Examination:  General exam:  Pleasant elderly female, frail, lying comfortably supine in bed. Respiratory system: Clear to auscultation. Respiratory effort normal. Cardiovascular system: S1 & S2 heard, RRR.Marland Kitchen No JVD, murmurs, rubs, gallops or clicks. No pedal edema. Gastrointestinal system: Abdomen is nondistended, soft and nontender. No organomegaly or masses felt. Normal bowel sounds heard. Central nervous system: Alert and oriented 2. No focal neurological deficits. Extremities: Symmetric 5 x 5 power except right lower extremity shortened and internally rotated where movements are limited secondary to pain. Skin: No rashes, lesions or ulcers Psychiatry: Judgement and insight appear normal. Mood & affect appropriate.     Data Reviewed: I have personally reviewed following labs and imaging studies  CBC:  Recent Labs Lab 11/24/15 0015  WBC 8.8  NEUTROABS 7.3  HGB 9.5*  HCT 29.1*  MCV 87.1  PLT [id number]   Basic Metabolic Panel:  Recent Labs Lab 11/24/15 0015 11/24/15 0518  NA 126* 130*  K 4.1 3.7  CL 97* 97*  CO2 25 23  GLUCOSE 176* 150*  BUN 10 8  CREATININE 0.42* 0.41*  CALCIUM 8.9 8.4*   GFR: Estimated Creatinine Clearance: 33.2 mL/min (by C-G formula based on Cr of 0.41). Liver Function Tests: No results for input(s): AST, ALT, ALKPHOS, BILITOT, PROT, ALBUMIN in the last 168 hours. No results for input(s): LIPASE, AMYLASE in the last 168 hours. No results for input(s): AMMONIA in the last 168 hours. Coagulation Profile:  Recent Labs Lab 11/24/15 0015  INR 1.17   Cardiac Enzymes:  Recent Labs Lab 11/24/15 0015  TROPONINI <0.03   BNP (last 3 results) No results for input(s): PROBNP in the last 8760 hours. HbA1C: No results for input(s): HGBA1C in the last 72 hours. CBG:  Recent Labs Lab 11/24/15 0719  GLUCAP 137*   Lipid Profile: No results for input(s): CHOL, HDL, LDLCALC, TRIG, CHOLHDL, LDLDIRECT in the last 72 hours. Thyroid Function Tests: No results for input(s): TSH,  T4TOTAL, FREET4, T3FREE, THYROIDAB in the last 72 hours. Anemia Panel: No results for input(s): VITAMINB12, FOLATE, FERRITIN, TIBC, IRON, RETICCTPCT in the last 72 hours. Urine analysis:    Component Value Date/Time   COLORURINE YELLOW 11/24/2015 0209   APPEARANCEUR CLOUDY* 11/24/2015 0209   LABSPEC 1.016 11/24/2015 0209   PHURINE 7.0 11/24/2015 0209   GLUCOSEU NEGATIVE 11/24/2015 0209   HGBUR NEGATIVE 11/24/2015 0209   BILIRUBINUR NEGATIVE 11/24/2015 0209   BILIRUBINUR neg 11/06/2015 1204   BILIRUBINUR negative 09/10/2015 1226   KETONESUR NEGATIVE 11/24/2015 0209   KETONESUR small (15)* 09/10/2015 1226   PROTEINUR NEGATIVE 11/24/2015 0209   PROTEINUR neg 11/06/2015 1204   PROTEINUR =30* 09/10/2015 1226   UROBILINOGEN >=8.0 11/06/2015 1204   NITRITE NEGATIVE 11/24/2015 0209   NITRITE neg 11/06/2015 1204   NITRITE Negative 09/10/2015 1226   LEUKOCYTESUR NEGATIVE 11/24/2015 0209   Sepsis Labs: @LABRCNTIP (procalcitonin:4,lacticidven:4)  ) Recent Results (from the past 240 hour(s))  MRSA PCR Screening     Status: None   Collection Time: 11/24/15  5:58 AM  Result Value Ref Range Status   MRSA by PCR NEGATIVE NEGATIVE Final    Comment:        The GeneXpert MRSA Assay (FDA approved for NASAL specimens only), is one component of a comprehensive MRSA colonization  surveillance program. It is not intended to diagnose MRSA infection nor to guide or monitor treatment for MRSA infections.          Radiology Studies: Dg Chest 1 View  11/24/2015  CLINICAL DATA:  Preop.  Fractured left hip EXAM: CHEST 1 VIEW COMPARISON:  None. FINDINGS: Study limited by leftward rotation and chin overlapping the left apex. Patchy streaky bilateral lung opacity at the bases and in the right upper lobe. Lung volumes are low. No cardiomegaly when accounting for leftward rotation. No edema, effusion, or pneumothorax. Osteopenia. IMPRESSION: Bilateral atelectasis or aspiration, greatest in the  retrocardiac lung. Electronically Signed   By: Monte Fantasia M.D.   On: 11/24/2015 01:09   Ct Angio Chest Pe W/cm &/or Wo Cm  11/24/2015  CLINICAL DATA:  Status post fall, with hypoxia and generalized chest soreness. Initial encounter. EXAM: CT ANGIOGRAPHY CHEST WITH CONTRAST TECHNIQUE: Multidetector CT imaging of the chest was performed using the standard protocol during bolus administration of intravenous contrast. Multiplanar CT image reconstructions and MIPs were obtained to evaluate the vascular anatomy. CONTRAST:  80 mL of Isovue 370 IV contrast COMPARISON:  Chest radiograph performed earlier today at 12:24 a.m. FINDINGS: Pulmonary embolus is noted within segmental and subsegmental branches to the right upper and lower lung lobes. There is suggestion of a small peripheral pulmonary infarct at the right upper lobe. Trace bilateral pleural effusions are noted. Mild left basilar airspace opacity may reflect atelectasis or possibly mild infection. Mild right basilar atelectasis is noted. No pneumothorax is identified. No masses are identified; no abnormal focal contrast enhancement is seen. The mediastinum is unremarkable in appearance. No mediastinal lymphadenopathy is seen. No pericardial effusion is identified. The great vessels are grossly unremarkable in appearance. No axillary lymphadenopathy is seen. The thyroid gland is unremarkable in appearance. Vague hypodensities within the liver reflect the patient's known hepatic metastases. Trace ascites noted about the liver and spleen. No acute osseous abnormalities are seen. Facet disease is noted at the lower cervical spine. Review of the MIP images confirms the above findings. IMPRESSION: 1. Pulmonary embolus within segmental and subsegmental branches to the right upper and lower lung lobes. 2. Suggestion of small peripheral pulmonary infarct at the right upper lobe. 3. Mild left basilar airspace opacity may reflect atelectasis or possibly mild infection.  4. Trace bilateral pleural effusions, with mild right basilar atelectasis. 5. Vague hypodensities within the liver reflect the patient's known hepatic metastases. 6. Trace ascites noted about the liver and spleen. Critical Value/emergent results were called by telephone at the time of interpretation on 11/24/2015 at 2:58 am to Dr. Theotis Burrow, who verbally acknowledged these results. Electronically Signed   By: Garald Balding M.D.   On: 11/24/2015 03:30   Dg Hip Unilat  With Pelvis 2-3 Views Right  11/23/2015  CLINICAL DATA:  Unwitnessed fall at nursing home with right hip deformity. Initial encounter. EXAM: DG HIP (WITH OR WITHOUT PELVIS) 2-3V RIGHT COMPARISON:  None. FINDINGS: Acute right femoral neck fracture with proximal displacement. No notable degenerative changes to the acetabulum. No evidence of pelvic ring fracture or diastasis. Osteopenia. No metastatic lesion seen in this location on 11/13/2015 CT. IMPRESSION: Displaced right femoral neck fracture. Electronically Signed   By: Monte Fantasia M.D.   On: 11/23/2015 23:58        Scheduled Meds: . antiseptic oral rinse  7 mL Mouth Rinse q12n4p  . chlorhexidine  15 mL Mouth Rinse BID  . donepezil  10 mg Oral QHS  .  famotidine  20 mg Oral BID  . fluticasone  1 spray Each Nare QHS  . insulin aspart  0-5 Units Subcutaneous QHS  . insulin aspart  0-9 Units Subcutaneous TID WC  . insulin glargine  15 Units Subcutaneous Q1200  . losartan  25 mg Oral Daily  . metoprolol succinate  12.5 mg Oral Daily  . polyethylene glycol  17 g Oral QHS  . sertraline  12.5 mg Oral Daily   Continuous Infusions: . heparin 800 Units/hr (11/24/15 0328)     LOS: 0 days    Time spent: 30 minutes.    [facility], MD [facility] Pager [phone]  If 7PM-7AM, please contact night-coverage [email or web address] Password [email or web address] 11/24/2015, 3:11 PM

## 2015-11-24 NOTE — ED Notes (Signed)
Patient transported to CT 

## 2015-11-24 NOTE — Progress Notes (Signed)
ANTICOAGULATION CONSULT NOTE - Follow Up Consult  Pharmacy Consult for Heparin Indication: pulmonary embolus  No Known Allergies  Patient Measurements: Height: 4\' 11"  (149.9 cm) Weight: 114 lb 3.2 oz (51.8 kg) IBW/kg (Calculated) : 43.2 Heparin Dosing Weight: 50 kg  Vital Signs: Temp: 98.6 F (37 C) (04/25 1006) Temp Source: Oral (04/25 1006) BP: 115/54 mmHg (04/25 1006) Pulse Rate: 102 (04/25 1006)  Labs:  Recent Labs  11/24/15 0015 11/24/15 0518 11/24/15 1210  HGB 9.5*  --   --   HCT 29.1*  --   --   PLT 248  --   --   LABPROT 15.1  --   --   INR 1.17  --   --   HEPARINUNFRC  --   --  0.35  CREATININE 0.42* 0.41*  --   TROPONINI <0.03  --   --     Estimated Creatinine Clearance: 33.2 mL/min (by C-G formula based on Cr of 0.41).   Medical History: Past Medical History  Diagnosis Date  . Diabetes ([place])   . GERD (gastroesophageal reflux disease)   . Idiopathic small intestinal ulcers   . Seasonal allergies   . Hypertension   . Hiatal hernia   . Degenerative arthritis of knee, bilateral     Medications:  Scheduled:  . antiseptic oral rinse  7 mL Mouth Rinse q12n4p  . chlorhexidine  15 mL Mouth Rinse BID  . donepezil  10 mg Oral QHS  . famotidine  20 mg Oral BID  . fluticasone  1 spray Each Nare QHS  . insulin aspart  0-5 Units Subcutaneous QHS  . insulin aspart  0-9 Units Subcutaneous TID WC  . insulin glargine  15 Units Subcutaneous Q1200  . losartan  25 mg Oral Daily  . metoprolol succinate  12.5 mg Oral Daily  . polyethylene glycol  17 g Oral QHS  . sertraline  12.5 mg Oral Daily   Infusions:  . heparin 800 Units/hr (11/24/15 0328)    Assessment: 82 yr female brought to ED s/p fall.  Xray shows femoral neck fracture.  Recently diagnosed with pancreatic tumor.  Pt with complaint of chest pain.  CTAngio shows several small PE's.  IV heparin per pharmacy dosing ordered.  Patient on no oral anticoagulation PTA.  Today, 11/24/2015: - First heparin  level therapeutic at 0.35. Infusion currently at 800 units/hr. - No bleeding/complications reported.   Goal of Therapy:  Heparin level 0.3-0.7 units/ml Monitor platelets by anticoagulation protocol: Yes   Plan:   Continue heparin infusion at 800 units/hr.  Recheck heparin level this evening at 2000.    Follow heparin level & CBC daily  Hershal Coria, PharmD 11/24/2015,1:12 PM

## 2015-11-24 NOTE — H&P (Signed)
History and Physical  Patient Name: Christine Copeland     [id number]    DOB: [birth year]    DOA: 11/23/2015 Referring provider: Theotis Burrow, MD PCP: Leeanne Rio, PA-C  Outpatient specialists: Leslie Dales, GI  Patient coming from: [facility]  Chief Complaint: Hip pain and chest pain  HPI: Christine Copeland is a 82 y.o. female with a past medical history significant for dementia, IDDM, recently diagnosed pancreatic mass with apparent liver metastasis who presents with fall and hip pain and chest pain.  All history collected from daughter and EDP given patient's dementia and confusion from acute illness.  They report she was in stable health today, until tonight, at her assisted living facility when she had a fall, unwitnessed.  Staff assisted her back to bed, but because she was crying from severe right hip pain and unable to bear weight, they called EMS for transport for suspected hip fracture.    In the ED, she was tachycardic, mildly hypoxic.  She reported her chronic epigastric/abdominal pain (severe, constant, stable) and also new chest pain.  Na 126 (baseline 134), K 4.1, Cr 0.4, WBC 8.8K, Hgb 9.5 (slightly below baseline ~11).  Troponin and UA normal.  ECG showed diffuse non-specific repolarization abnormalities, CXR unremarkable.  RIGHT hip radiograph showed a displaced femoral neck fracture and the case was discussed with Dr. Rhona Raider who recommended medical admission because of suspected metastatic pancreatic cancer.  A CT angiogram was ordered given her suspected cancer, chest pain and hypoxia which showed segmental and subsegmental RIGHT sided PE without heart strain, and the patient was started on heparin gtt.  She has no personal history of VTE.  TRH were asked to admit.  The patient has had about 4 months of epigastric and right sided abdominal pain.  Lab work showed increased LFTs and a follow up RUQ US showed multifocal mass lesions, better  imaged with CT 2 weeks ago (which also showed a pancreatic mass extending beyond the pancreas as well as peritoneal masses suspicious for metastasis as well).  The patient is in the process of referral to both Hospice and Oncology.    Review of Systems:  Unable to obtain due to patient mentation and dementia.    Past Medical History  Diagnosis Date  . Diabetes ([place])   . GERD (gastroesophageal reflux disease)   . Idiopathic small intestinal ulcers   . Seasonal allergies   . Hypertension   . Hiatal hernia   . Degenerative arthritis of knee, bilateral     Past Surgical History  Procedure Laterality Date  . Dilation and curettage of uterus      x3  . Knee arthroscopy      left  . Wisdom tooth extraction      Social History: Patient lives at [facility], [place].  She is from [place] [facility] [place] originally.  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  Her only surviving family member is her POA/great niece BJ Poluk.  She is not a smoker.  Uses a walker at baseline.  Dementia at baseline, over last 6 months has had more trouble with orientation to year, situation, per POA.   Participates in all ADLs.  No Known Allergies  Family history: family history includes Arthritis in her sister; Breast cancer in her sister; Diabetes in her maternal grandfather, mother, and sister; Hypertension in her mother and sister; Prostate cancer in her father; Stroke in her sister; Tuberculosis in her paternal uncle.   Prior to Admission medications  Medication Sig Start Date End Date Taking? Authorizing Provider  acetaminophen (TYLENOL) 500 MG tablet Take 500 mg by mouth 2 (two) times daily.  10/23/15  Yes Brunetta Jeans, PA-C  aspirin 81 MG chewable tablet Chew 81 mg by mouth daily.   Yes Historical Provider, MD  Calcium Carbonate-Vitamin D (CALTRATE 600+D) 600-400 MG-UNIT tablet Take 1 tablet by mouth daily.   Yes Historical Provider, MD  donepezil (ARICEPT) 10 MG tablet Take 1 tablet (10 mg total) by mouth at bedtime.  10/23/15  Yes Brunetta Jeans, PA-C  fluticasone (FLONASE) 50 MCG/ACT nasal spray Place 1 spray into both nostrils at bedtime.    Yes Historical Provider, MD  ibuprofen (ADVIL,MOTRIN) 200 MG tablet Take 400 mg by mouth every 6 (six) hours as needed (for pain).   Yes Historical Provider, MD  Insulin Glargine (BASAGLAR KWIKPEN) 100 UNIT/ML SOPN Inject 0.15 mLs (15 Units total) into the skin daily at 12 noon. 11/04/15  Yes Renato Shin, MD  JANUVIA 50 MG tablet TAKE ONE TABLET BY MOUTH ONCE DAILY 11/03/15  Yes Brunetta Jeans, PA-C  losartan (COZAAR) 50 MG tablet Take 0.5 tablets (25 mg total) by mouth daily. 10/26/15  Yes Brunetta Jeans, PA-C  metoprolol succinate (TOPROL-XL) 25 MG 24 hr tablet TAKE ONE-HALF TABLET DAILY 08/31/15  Yes Brunetta Jeans, PA-C  polyethylene glycol (MIRALAX / GLYCOLAX) packet Take 17 g by mouth at bedtime.    Yes Historical Provider, MD  Psyllium 500 MG CAPS Take 500 mg by mouth at bedtime.   Yes Historical Provider, MD  ranitidine (ZANTAC) 150 MG tablet Take 1 tablet (150 mg total) by mouth 2 (two) times daily. Reported on 09/10/2015 10/23/15  Yes Brunetta Jeans, PA-C  sertraline (ZOLOFT) 25 MG tablet TAKE ONE-HALF TABLET DAILY 08/31/15  Yes Brunetta Jeans, PA-C  glucose blood (ONETOUCH VERIO) test strip Use to check blood sugar 3 times per day. Dx code: E11.9 11/11/15   Renato Shin, MD  Lancets [facility] ULTRASOFT) lancets Use as instructed. Check sugars twice daily. 10/26/15   Brunetta Jeans, PA-C       Physical Exam: BP 135/73 mmHg  Pulse 95  Temp(Src) 98 F (36.7 C) (Oral)  Resp 17  Wt 50.5 kg (111 lb 5.3 oz)  SpO2 100% General appearance: Frail elderly female, wakes to voice, but confused. Not oriented on my exam to time, place or situation, only her niece.   Eyes: Anicteric, conjunctiva pink, lids and lashes normal.     ENT: No nasal deformity, discharge, or epistaxis.  OP moist without lesions.   Skin: Warm and dry.  No suspicious rashes or  lesions. Cardiac: RRR, nl S1-S2, no murmurs appreciated.  Capillary refill is brisk.  JVP normal.  No LE edema.  Radial and DP pulses 2+ and symmetric. Respiratory: Normal respiratory rate and rhythm.  CTAB without rales or wheezes. Abdomen: Abdomen soft without rigidity, large old ventral hernia.  Moderate primarily epigastric TTP, no distinct mass, but firmness to abdomen that is abnormal. No ascites, distension.   MSK: No deformities or effusions. Neuro: Not oriented.  Confused, sleepy.  Follows commands.  Cranial nerves normal.  Speech is fluent.  Upper extremity movements coordinated and symmetric. Psych: Affect anxious.  No evidence of aural or visual hallucinations or delusions.       Labs on Admission:  I have personally reviewed the following studies: The metabolic panel shows hyponatremia. Troponin negative. INR normal. Urinalysis clear. The complete blood count shows chronic stable  normocytic anemia.   Radiological Exams on Admission: Personally reviewed: Dg Chest 1 View  11/24/2015  CLINICAL DATA:  Preop.  Fractured left hip EXAM: CHEST 1 VIEW COMPARISON:  None. FINDINGS: Study limited by leftward rotation and chin overlapping the left apex. Patchy streaky bilateral lung opacity at the bases and in the right upper lobe. Lung volumes are low. No cardiomegaly when accounting for leftward rotation. No edema, effusion, or pneumothorax. Osteopenia. IMPRESSION: Bilateral atelectasis or aspiration, greatest in the retrocardiac lung. Electronically Signed   By: Monte Fantasia M.D.   On: 11/24/2015 01:09   Ct Angio Chest Pe W/cm &/or Wo Cm  11/24/2015  CLINICAL DATA:  Status post fall, with hypoxia and generalized chest soreness. Initial encounter. EXAM: CT ANGIOGRAPHY CHEST WITH CONTRAST TECHNIQUE: Multidetector CT imaging of the chest was performed using the standard protocol during bolus administration of intravenous contrast. Multiplanar CT image reconstructions and MIPs were  obtained to evaluate the vascular anatomy. CONTRAST:  80 mL of Isovue 370 IV contrast COMPARISON:  Chest radiograph performed earlier today at 12:24 a.m. FINDINGS: Pulmonary embolus is noted within segmental and subsegmental branches to the right upper and lower lung lobes. There is suggestion of a small peripheral pulmonary infarct at the right upper lobe. Trace bilateral pleural effusions are noted. Mild left basilar airspace opacity may reflect atelectasis or possibly mild infection. Mild right basilar atelectasis is noted. No pneumothorax is identified. No masses are identified; no abnormal focal contrast enhancement is seen. The mediastinum is unremarkable in appearance. No mediastinal lymphadenopathy is seen. No pericardial effusion is identified. The great vessels are grossly unremarkable in appearance. No axillary lymphadenopathy is seen. The thyroid gland is unremarkable in appearance. Vague hypodensities within the liver reflect the patient's known hepatic metastases. Trace ascites noted about the liver and spleen. No acute osseous abnormalities are seen. Facet disease is noted at the lower cervical spine. Review of the MIP images confirms the above findings. IMPRESSION: 1. Pulmonary embolus within segmental and subsegmental branches to the right upper and lower lung lobes. 2. Suggestion of small peripheral pulmonary infarct at the right upper lobe. 3. Mild left basilar airspace opacity may reflect atelectasis or possibly mild infection. 4. Trace bilateral pleural effusions, with mild right basilar atelectasis. 5. Vague hypodensities within the liver reflect the patient's known hepatic metastases. 6. Trace ascites noted about the liver and spleen. Critical Value/emergent results were called by telephone at the time of interpretation on 11/24/2015 at 2:58 am to Dr. Theotis Burrow, who verbally acknowledged these results. Electronically Signed   By: Garald Balding M.D.   On: 11/24/2015 03:30   Dg Hip Unilat   With Pelvis 2-3 Views Right  11/23/2015  CLINICAL DATA:  Unwitnessed fall at nursing home with right hip deformity. Initial encounter. EXAM: DG HIP (WITH OR WITHOUT PELVIS) 2-3V RIGHT COMPARISON:  None. FINDINGS: Acute right femoral neck fracture with proximal displacement. No notable degenerative changes to the acetabulum. No evidence of pelvic ring fracture or diastasis. Osteopenia. No metastatic lesion seen in this location on 11/13/2015 CT. IMPRESSION: Displaced right femoral neck fracture. Electronically Signed   By: Monte Fantasia M.D.   On: 11/23/2015 23:58    EKG: Independently reviewed. Rate 87, EDC 441, sinus rhythm, diffuse T-wave flattening, nonspecific. No ST changes.    Assessment/Plan 1. Displaced RIGHT femoral neck fracture:  This is new.  From mechanical fall tonight. -Admit to telemetry -Consult to Palliative care medicine -Hydrocodone-acetaminophen or morphine as tolerated for pain -Bed rest, apply ice,  document sedation and vitals per Hip fracture protocol -Consult to Orthopedics -Likely not operative candidate given #2 and #3   2. Right segmental and subsegmental PE, hemodynamically stable:  Noted incidentally tonight.  Currently hemodynamically stable. No previous history of DVT. Suspected active cancer. -Continue heparin for now -Palliative medicine consult as above  3. New pancreatic mass with liver masses and peritoneal masses:  These were noted this month as part of work up of abdominal pain with incidentally noted LFT elevations.  Patient is in process of referral to both an Oncologist and Hospice  4. Anemia of chronic disease:  Stable hemoglobin at 9.5 g/dL  5. Hyponatremia:  Got fluids in ER -Check free water clearance, trend Na and restrict if necessary  6. IDDM:  -Continue home Basaglar 15 daily -Sliding scale corrections for now  7. HTN:  -Continue losartan and metoprolol  8. Depression in dementia:  -Continue donepezil and  sertraline    DVT prophylaxis: Heparin  Code Status: DO NOT RESUSCITATE  Family Communication: Niece at bedside.  PE and fracture discussed.  Suspected cancer and Hospice discussed.  Palliative care while inpatient discussed. CODE STATUS confirmed.  Disposition Plan: Anticipate admission to telemetry, consult to palliative care. Risk of surgery given new PE is high.  Discussion of non-operative options with Orthopedics.   Medical decision making and consults: Patient seen at 3:20 AM on 11/24/2015.  The patient was discussed with Dr. Rex Kras. I recommend admission to telemetry, inpatient status.  Clinical condition: stable, overall prognosis poor.      Edwin Dada [facility] Pager [phone]

## 2015-11-24 NOTE — ED Notes (Signed)
Called ortho again and spoke with [facility]

## 2015-11-24 NOTE — ED Provider Notes (Signed)
CSN: [id number]     Arrival date & time 11/23/15  2303 History   First MD Initiated Contact with Patient 11/24/15 0008     Chief Complaint  Patient presents with  . Fall     (Consider location/radiation/quality/duration/timing/severity/associated sxs/prior Treatment) HPI Comments: 82 year old female with past medical history including type 2 diabetes mellitus, hypertension, hiatal hernia, GERD, recently diagnosed pancreatic tumor, dementia who presents with right hip pain after a fall. He must reports that the patient was at her senior living facility where she fell tonight, unknown details. Staff put her back into her bed but she continued to complain of right-sided pelvic pain. EMS gave her fentanyl in route. Family has noted that she has had increasing falls recently after she was treated for a UTI. Daughter notes that the patient does have a history of memory problems. She recently had a CT of the abdomen and pelvis where they noted a pancreatic mass which is likely cancer. They have not yet seen an oncologist. The patient mentioned central chest pain during interview but was unable to characterize any further. Daughter states that she has not had any fevers, cough/cold symptoms, or recent illness. She has not complained of any chest pain before tonight.  Patient is a 82 y.o. female presenting with fall. The history is provided by the EMS personnel, a relative and the patient.  Fall    Past Medical History  Diagnosis Date  . Diabetes ([place])   . GERD (gastroesophageal reflux disease)   . Idiopathic small intestinal ulcers   . Seasonal allergies   . Hypertension   . Hiatal hernia   . Degenerative arthritis of knee, bilateral    Past Surgical History  Procedure Laterality Date  . Dilation and curettage of uterus      x3  . Knee arthroscopy      left  . Wisdom tooth extraction     Family History  Problem Relation Age of Onset  . Hypertension Mother   . Prostate cancer Father   .  Diabetes Mother   . Diabetes Maternal Grandfather   . Tuberculosis Paternal Uncle   . Diabetes Sister     x3  . Stroke Sister     x2  . Arthritis Sister     x2  . Breast cancer Sister     x2  . Hypertension Sister     x5   Social History  Substance Use Topics  . Smoking status: Never Smoker   . Smokeless tobacco: Never Used  . Alcohol Use: None   OB History    No data available     Review of Systems  Unable to perform ROS: Dementia      Allergies  Review of patient's allergies indicates no known allergies.  Home Medications   Prior to Admission medications   Medication Sig Start Date End Date Taking? Authorizing Provider  acetaminophen (TYLENOL) 500 MG tablet Take 500 mg by mouth 2 (two) times daily.  10/23/15  Yes Brunetta Jeans, PA-C  aspirin 81 MG chewable tablet Chew 81 mg by mouth daily.   Yes Historical Provider, MD  Calcium Carbonate-Vitamin D (CALTRATE 600+D) 600-400 MG-UNIT tablet Take 1 tablet by mouth daily.   Yes Historical Provider, MD  donepezil (ARICEPT) 10 MG tablet Take 1 tablet (10 mg total) by mouth at bedtime. 10/23/15  Yes Brunetta Jeans, PA-C  fluticasone (FLONASE) 50 MCG/ACT nasal spray Place 1 spray into both nostrils at bedtime.    Yes Historical Provider,  MD  ibuprofen (ADVIL,MOTRIN) 200 MG tablet Take 400 mg by mouth every 6 (six) hours as needed (for pain).   Yes Historical Provider, MD  Insulin Glargine (BASAGLAR KWIKPEN) 100 UNIT/ML SOPN Inject 0.15 mLs (15 Units total) into the skin daily at 12 noon. 11/04/15  Yes Renato Shin, MD  JANUVIA 50 MG tablet TAKE ONE TABLET BY MOUTH ONCE DAILY 11/03/15  Yes Brunetta Jeans, PA-C  losartan (COZAAR) 50 MG tablet Take 0.5 tablets (25 mg total) by mouth daily. 10/26/15  Yes Brunetta Jeans, PA-C  metoprolol succinate (TOPROL-XL) 25 MG 24 hr tablet TAKE ONE-HALF TABLET DAILY 08/31/15  Yes Brunetta Jeans, PA-C  polyethylene glycol (MIRALAX / GLYCOLAX) packet Take 17 g by mouth at bedtime.    Yes  Historical Provider, MD  Psyllium 500 MG CAPS Take 500 mg by mouth at bedtime.   Yes Historical Provider, MD  ranitidine (ZANTAC) 150 MG tablet Take 1 tablet (150 mg total) by mouth 2 (two) times daily. Reported on 09/10/2015 10/23/15  Yes Brunetta Jeans, PA-C  sertraline (ZOLOFT) 25 MG tablet TAKE ONE-HALF TABLET DAILY 08/31/15  Yes Brunetta Jeans, PA-C  glucose blood (ONETOUCH VERIO) test strip Use to check blood sugar 3 times per day. Dx code: E11.9 11/11/15   Renato Shin, MD  Lancets [facility] ULTRASOFT) lancets Use as instructed. Check sugars twice daily. 10/26/15   Brunetta Jeans, PA-C   BP 138/66 mmHg  Pulse 93  Temp(Src) 98 F (36.7 C) (Oral)  Resp 13  SpO2 94% Physical Exam  ED Course  .Critical Care Performed by: Sharlett Iles Authorized by: Sharlett Iles Total critical care time: 45 minutes Critical care time was exclusive of separately billable procedures and treating other patients. Critical care was necessary to treat or prevent imminent or life-threatening deterioration of the following conditions: respiratory failure. Critical care was time spent personally by me on the following activities: development of treatment plan with patient or surrogate, discussions with consultants, evaluation of patient's response to treatment, examination of patient, obtaining history from patient or surrogate, ordering and performing treatments and interventions, ordering and review of laboratory studies, ordering and review of radiographic studies, pulse oximetry, re-evaluation of patient's condition and review of old charts.   (including critical care time) Labs Review Labs Reviewed  BASIC METABOLIC PANEL - Abnormal; Notable for the following:    Sodium 126 (*)    Chloride 97 (*)    Glucose, Bld 176 (*)    Creatinine, Ser 0.42 (*)    Anion gap 4 (*)    All other components within normal limits  CBC WITH DIFFERENTIAL/PLATELET - Abnormal; Notable for the following:     RBC 3.34 (*)    Hemoglobin 9.5 (*)    HCT 29.1 (*)    All other components within normal limits  URINALYSIS, ROUTINE W REFLEX MICROSCOPIC (NOT AT [facility]) - Abnormal; Notable for the following:    APPearance CLOUDY (*)    All other components within normal limits  PROTIME-INR  TROPONIN I    Imaging Review Dg Chest 1 View  11/24/2015  CLINICAL DATA:  Preop.  Fractured left hip EXAM: CHEST 1 VIEW COMPARISON:  None. FINDINGS: Study limited by leftward rotation and chin overlapping the left apex. Patchy streaky bilateral lung opacity at the bases and in the right upper lobe. Lung volumes are low. No cardiomegaly when accounting for leftward rotation. No edema, effusion, or pneumothorax. Osteopenia. IMPRESSION: Bilateral atelectasis or aspiration, greatest in the retrocardiac  lung. Electronically Signed   By: Monte Fantasia M.D.   On: 11/24/2015 01:09   Dg Hip Unilat  With Pelvis 2-3 Views Right  11/23/2015  CLINICAL DATA:  Unwitnessed fall at nursing home with right hip deformity. Initial encounter. EXAM: DG HIP (WITH OR WITHOUT PELVIS) 2-3V RIGHT COMPARISON:  None. FINDINGS: Acute right femoral neck fracture with proximal displacement. No notable degenerative changes to the acetabulum. No evidence of pelvic ring fracture or diastasis. Osteopenia. No metastatic lesion seen in this location on 11/13/2015 CT. IMPRESSION: Displaced right femoral neck fracture. Electronically Signed   By: Monte Fantasia M.D.   On: 11/23/2015 23:58   I have personally reviewed and evaluated these images and lab results as part of my medical decision-making.   EKG Interpretation   Date/Time:  Monday November 23 2015 23:14:29 EDT Ventricular Rate:  87 PR Interval:  111 QRS Duration: 93 QT Interval:  367 QTC Calculation: 441 R Axis:   -27 Text Interpretation:  Sinus rhythm Borderline short PR interval Borderline  left axis deviation Borderline repolarization abnormality Baseline wander  in lead(s) V3 V4 T wave  inversions diffusely, no previous available  Confirmed by Prue Lingenfelter MD, Latona Krichbaum [phone]) on 11/24/2015 1:09:19 AM     Medications  fentaNYL (SUBLIMAZE) injection 50 mcg (not administered)  morphine 4 MG/ML injection 4 mg (4 mg Intravenous Given 11/24/15 0113)  sodium chloride 0.9 % bolus 1,000 mL (1,000 mLs Intravenous New Bag/Given 11/24/15 0156)  iopamidol (ISOVUE-370) 76 % injection 100 mL (80 mLs Intravenous Contrast Given 11/24/15 0238)    MDM   Final diagnoses:  Fall   She presents with right hip pain after a fall. On exam, she was awake, oriented to person, confused but in no acute distress. Vital signs stable. She had shortening and external rotation of her right leg concerning for fracture. She was neurovascularly intact distally. She noted chest pain but I was not able to elicit tenderness on exam. Obtained above lab work as well as plain films of hip which showed displaced right femoral neck fracture. Added chest x-ray as the patient is hypoxic to 91% on room air. Gave fentanyl and later morphine for pain. Discussed with orthopedics, Dr. Rhona Raider, who will see patient in consultation.   Labwork shows mild hyponatremia with sodium 126, hemoglobin 9.5, normal urine, negative troponin. Gave IV fluid bolus. Chest x-ray shows bilateral atelectasis versus aspiration. Because of the patient's hypoxia and known cancer, obtained CTA of the chest to evaluate for PE. CT shows several small PEs, discussed findings w/ radiologist. Initiated heparin drip per PE protocol. I discussed findings with the patient's daughter. Discussed admission with [facility] hospitalist, Dr. Loleta Books, and patient admitted for further care.   Sharlett Iles, MD 11/24/15 [phone]

## 2015-11-24 NOTE — ED Notes (Signed)
Called Ortho and spoke to [place] [facility]

## 2015-11-24 NOTE — Progress Notes (Signed)
ANTICOAGULATION CONSULT NOTE - Follow Up Consult  Pharmacy Consult for Heparin Indication: pulmonary embolus  Infusions:  . heparin 800 Units/hr (11/24/15 1950)    Assessment: 82 yr female brought to ED s/p fall.  Xray shows femoral neck fracture.  Recently diagnosed with pancreatic tumor.  Pt with complaint of chest pain.  CTAngio shows several small PE's.  IV heparin per pharmacy dosing ordered.  Patient on no oral anticoagulation PTA.  Today, 11/24/2015: - Infusion currently at 800 units/hr, however, IV was removed at 1825 and not restarted until 2010. - Heparin level <0.1 is subtherapeutic (heparin drip off ~ 1 hour prior to lab draw) - No bleeding/complications reported.    Goal of Therapy:  Heparin level 0.3-0.7 units/ml Monitor platelets by anticoagulation protocol: Yes   Plan:   Continue heparin infusion at 800 units/hr.  Recheck heparin level ~ 8 hours after restart  Follow heparin level & CBC daily   Gretta Arab PharmD, BCPS Pager [phone] 11/24/2015 8:35 PM

## 2015-11-24 NOTE — Consult Note (Addendum)
Consultation Note Date: 11/24/2015   Patient Name: Christine Copeland  DOB: 02-23-1925  MRN: [id number]  Age / Sex: 82 y.o., female  PCP: Brunetta Jeans, PA-C Referring Physician: Modena Jansky, MD  Reason for Consultation: Disposition, Establishing goals of care and Hospice Evaluation  HPI/Patient Profile: 82 y.o. female  with past medical history of dementia, IDDM, recently diagnosed pancreatic mass with apparent liver metastasis admitted on 11/23/2015 with fall and fractured hip. She has been found to have PE.   Clinical Assessment and Goals of Care: I met today with patient's great niece and we had a long discussion regarding her clinical course of the past several months. She reports that her and has been having a decrease in her nutrition, functional status, and cognition prior to this admission. She then had an acute fall with fractured hip and since being at the hospital has largely been somnolent. She has not been taking in much nutrition or hydration.  We discussed pathways moving forward including plan for focus on comfort with support of hospice. She reports that she took care of her father at home with hospice and is very familiar with the services they can offer. She states that this is what she would like for her aunt moving forward.  SUMMARY OF RECOMMENDATIONS   - Patient's niece is interested in transitioning home with hospice support. She lives on third floor of apartment building in [place]. - I placed a consult to care management to begin process of referral to home hospice.  Code Status/Advance Care Planning:    Code Status Orders        Start     Ordered   11/24/15 0454  Do not attempt resuscitation (DNR)   Continuous    Question Answer Comment  In the event of cardiac or respiratory ARREST Do not call a "code blue"   In the event of cardiac or respiratory ARREST Do not perform  Intubation, CPR, defibrillation or ACLS   In the event of cardiac or respiratory ARREST Use medication by any route, position, wound care, and other measures to relive pain and suffering. May use oxygen, suction and manual treatment of airway obstruction as needed for comfort.      11/24/15 0453    Code Status History    Date Active Date Inactive Code Status Order ID Comments User Context   This patient has a current code status but no historical code status.    Advance Directive Documentation        Most Recent Value   Type of Advance Directive  Out of facility DNR (pink MOST or yellow form)   Pre-existing out of facility DNR order (yellow form or pink MOST form)     "MOST" Form in Place?       Symptom Management:   Pain: Well controlled. Continue same.  Palliative Prophylaxis:   Aspiration, Delirium Protocol and Frequent Pain Assessment  Psycho-social/Spiritual:   Desire for further Chaplaincy support:no  Additional Recommendations: Education on Hospice  Prognosis:   <  2 weeks if her nutritional status continues to remain compromised. She is currently not taking in any significant nutrition or hydration. She also has a known acute PE as well as metastatic pancreatic cancer and is at high risk for continued decompensation and death at any point.  Discharge Planning: Home with Hospice      Primary Diagnoses: Present on Admission:  . Pulmonary embolism ([place]) . Displaced fracture of right femoral neck ([place]) . Essential hypertension, benign . Dementia, senile with depression . Hyponatremia . Anemia, chronic disease . Pancreatic mass  I have reviewed the medical record, interviewed the patient and family, and examined the patient. The following aspects are pertinent.  Past Medical History  Diagnosis Date  . Diabetes ([place])   . GERD (gastroesophageal reflux disease)   . Idiopathic small intestinal ulcers   . Seasonal allergies   . Hypertension   . Hiatal hernia   .  Degenerative arthritis of knee, bilateral    Social History   Social History  . Marital Status: Single    Spouse Name: N/A  . Number of Children: 0  . Years of Education: N/A   Occupational History  . retired Marine scientist    Social History Main Topics  . Smoking status: Never Smoker   . Smokeless tobacco: Never Used  . Alcohol Use: None  . Drug Use: None  . Sexual Activity: Not Asked   Other Topics Concern  . None   Social History Narrative   Family History  Problem Relation Age of Onset  . Hypertension Mother   . Prostate cancer Father   . Diabetes Mother   . Diabetes Maternal Grandfather   . Tuberculosis Paternal Uncle   . Diabetes Sister     x3  . Stroke Sister     x2  . Arthritis Sister     x2  . Breast cancer Sister     x2  . Hypertension Sister     x5   Scheduled Meds: . antiseptic oral rinse  7 mL Mouth Rinse q12n4p  . chlorhexidine  15 mL Mouth Rinse BID  . donepezil  10 mg Oral QHS  . famotidine  20 mg Oral BID  . fluticasone  1 spray Each Nare QHS  . insulin aspart  0-5 Units Subcutaneous QHS  . insulin aspart  0-9 Units Subcutaneous TID WC  . insulin glargine  15 Units Subcutaneous Q1200  . losartan  25 mg Oral Daily  . metoprolol succinate  12.5 mg Oral Daily  . polyethylene glycol  17 g Oral QHS  . sertraline  12.5 mg Oral Daily   Continuous Infusions: . heparin 800 Units/hr (11/24/15 0328)   PRN Meds:.HYDROcodone-acetaminophen, morphine injection, senna-docusate Medications Prior to Admission:  Prior to Admission medications   Medication Sig Start Date End Date Taking? Authorizing Provider  Calcium Carbonate-Vitamin D (CALTRATE 600+D) 600-400 MG-UNIT tablet Take 1 tablet by mouth daily.   Yes Historical Provider, MD  donepezil (ARICEPT) 10 MG tablet Take 1 tablet (10 mg total) by mouth at bedtime. 10/23/15  Yes Brunetta Jeans, PA-C  fluticasone (FLONASE) 50 MCG/ACT nasal spray Place 1 spray into both nostrils at bedtime.    Yes Historical  Provider, MD  ibuprofen (ADVIL,MOTRIN) 200 MG tablet Take 400 mg by mouth every 6 (six) hours as needed (for pain).   Yes Historical Provider, MD  Insulin Glargine (BASAGLAR KWIKPEN) 100 UNIT/ML SOPN Inject 0.15 mLs (15 Units total) into the skin daily at 12 noon. 11/04/15  Yes Hilliard Clark  Loanne Drilling, MD  JANUVIA 50 MG tablet TAKE ONE TABLET BY MOUTH ONCE DAILY 11/03/15  Yes Brunetta Jeans, PA-C  losartan (COZAAR) 50 MG tablet Take 0.5 tablets (25 mg total) by mouth daily. 10/26/15  Yes Brunetta Jeans, PA-C  metoprolol succinate (TOPROL-XL) 25 MG 24 hr tablet TAKE ONE-HALF TABLET DAILY 08/31/15  Yes Brunetta Jeans, PA-C  polyethylene glycol (MIRALAX / GLYCOLAX) packet Take 17 g by mouth at bedtime.    Yes Historical Provider, MD  Psyllium 500 MG CAPS Take 500 mg by mouth at bedtime.   Yes Historical Provider, MD  ranitidine (ZANTAC) 150 MG tablet Take 1 tablet (150 mg total) by mouth 2 (two) times daily. Reported on 09/10/2015 10/23/15  Yes Brunetta Jeans, PA-C  sertraline (ZOLOFT) 25 MG tablet TAKE ONE-HALF TABLET DAILY 08/31/15  Yes Brunetta Jeans, PA-C  CALCIUM 600/VITAMIN D3 600-800 MG-UNIT TABS TAKE ONE TABLET BY MOUTH ONCE DAILY AT BEDTIME 11/24/15   Brunetta Jeans, PA-C  glucose blood (ONETOUCH VERIO) test strip Use to check blood sugar 3 times per day. Dx code: E11.9 11/11/15   Renato Shin, MD  Lancets [facility] ULTRASOFT) lancets Use as instructed. Check sugars twice daily. 10/26/15   Brunetta Jeans, PA-C  QC CHILDRENS ASPIRIN 81 MG chewable tablet CHEW ONE TABLET DAILY 11/24/15   Brunetta Jeans, PA-C  QC NON-ASPIRIN EXTRA STRENGTH 500 MG tablet TAKE ONE TABLET TWICE DAILY FOR PAIN 11/24/15   Brunetta Jeans, PA-C   No Known Allergies Review of Systems  Physical Exam  Vital Signs: BP 120/66 mmHg  Pulse 103  Temp(Src) 98.8 F (37.1 C) (Oral)  Resp 18  Ht _0  (1.499 m)  Wt 51.8 kg (114 lb 3.2 oz)  BMI 23.05 kg/m2  SpO2 95% Pain Assessment: 0-10   Pain Score: 5    SpO2: SpO2: 95  % O2 Device:SpO2: 95 % O2 Flow Rate: .O2 Flow Rate (L/min): 2 L/min  IO: Intake/output summary:  Intake/Output Summary (Last 24 hours) at 11/24/15 1815 Last data filed at 11/24/15 0426  Gross per 24 hour  Intake      0 ml  Output    700 ml  Net   -700 ml    LBM: Last BM Date: 11/23/15 Baseline Weight: Weight: 50.5 kg (111 lb 5.3 oz) Most recent weight: Weight: 51.8 kg (114 lb 3.2 oz)     Palliative Assessment/Data:   Flowsheet Rows        Most Recent Value   Intake Tab    Referral Department  Hospitalist   Unit at Time of Referral  Cardiac/Telemetry Unit   Palliative Care Primary Diagnosis  Neurology   Date Notified  11/24/15   Palliative Care Type  New Palliative care   Reason for referral  Clarify Goals of Care   Date of Admission  11/23/15   Date first seen by Palliative Care  11/24/15   # of days IP prior to Palliative referral  1   Clinical Assessment    Palliative Performance Scale Score  10%   Pain Max last 24 hours  Not able to report   Pain Min Last 24 hours  Not able to report   Psychosocial & Spiritual Assessment    Palliative Care Outcomes    Patient/Family meeting held?  Yes   Who was at the meeting?  Great niece   Palliative Care Outcomes  Clarified goals of care, Counseled regarding hospice      Time In: 1105 Time  Out: 1220  Time Total: 75 Greater than 50%  of this time was spent counseling and coordinating care related to the above assessment and plan.  Signed by: Micheline Rough, MD   Please contact Palliative Medicine Team phone at [phone] for questions and concerns.

## 2015-11-24 NOTE — Care Management Note (Signed)
Case Management Note  Patient Details  Name: Christine Copeland MRN: [id number] Date of Birth: 03-16-1925  Subjective/Objective: 82 y/o f admitted w/displaced r femoral neck fx, PE. From ALF-[facility].Hx: dementia.DNR. Palliative meeting-Referral for home w/hospice choice-Spoke  Great niece-Bernadette Ploux c#[phone]-[facility] for referral tel#[phone], [place], liason Quenton Fetter will assess for acceptance-await outcome.Per great niece patient will stay w/her:address:[address] [place] [address]. Current dme needs-hospital bed,02.Will need ambulance transp.                 Action/Plan:d/c plan home w/hospice.   Expected Discharge Date:                  Expected Discharge Plan:  Home w Hospice Care  In-House Referral:     Discharge planning Services  CM Consult  Post Acute Care Choice:    Choice offered to:  [facility] POA / Guardian  DME Arranged:    DME Agency:     HH Arranged:    [place] Agency:     Status of Service:  In process, will continue to follow  Medicare Important Message Given:    Date Medicare IM Given:    Medicare IM give by:    Date Additional Medicare IM Given:    Additional Medicare Important Message give by:     If discussed at [place] of Stay Meetings, dates discussed:    Additional Comments:  Dessa Phi, RN 11/24/2015, 12:15 PM

## 2015-11-24 NOTE — Progress Notes (Signed)
ANTICOAGULATION CONSULT NOTE - Initial Consult  Pharmacy Consult for Heparin Indication: pulmonary embolus  No Known Allergies  Patient Measurements: Weight: 111 lb 5.3 oz (50.5 kg) Heparin Dosing Weight: 50 kg  Vital Signs: Temp: 98 F (36.7 C) (04/24 2305) Temp Source: Oral (04/24 2305) BP: 138/66 mmHg (04/25 0111) Pulse Rate: 93 (04/24 2305)  Labs:  Recent Labs  11/24/15 0015  HGB 9.5*  HCT 29.1*  PLT 248  LABPROT 15.1  INR 1.17  CREATININE 0.42*  TROPONINI <0.03    Estimated Creatinine Clearance: 33.2 mL/min (by C-G formula based on Cr of 0.42).   Medical History: Past Medical History  Diagnosis Date  . Diabetes ([place])   . GERD (gastroesophageal reflux disease)   . Idiopathic small intestinal ulcers   . Seasonal allergies   . Hypertension   . Hiatal hernia   . Degenerative arthritis of knee, bilateral     Medications:  Scheduled:  . heparin  2,500 Units Intravenous Once   Infusions:  . heparin      Assessment:  82 yr female brought to ED s/p fall.  Xray shows femoral neck fracture.  Recently diagnosed with pancreatic tumor.  Pt with complaint of chest pain.   CTAngio shows several small PE's  IV heparin per pharmacy dosing ordered  Patient on no oral anticoagulation PTA  Goal of Therapy:  Heparin level 0.3-0.7 units/ml Monitor platelets by anticoagulation protocol: Yes   Plan:   Heparin 2500 unit IV bolus x 1 followed by heparin gtt @ 800 units/hr  Check Heparin Level 8 hr after heparin started  Follow heparin level & CBC daily  Ramere Downs, Toribio Harbour, PharmD 11/24/2015,3:10 AM

## 2015-11-24 NOTE — Progress Notes (Signed)
Initial Nutrition Assessment  DOCUMENTATION CODES:   Not applicable  INTERVENTION:  - RD will monitor for final decision on POC/GOC - Monitor for needs for nutrition supplements or additional nutrition-related interventions - Recommend diet liberalization  NUTRITION DIAGNOSIS:   Increased nutrient needs related to catabolic illness, cancer and cancer related treatments as evidenced by estimated needs.  GOAL:   Patient will meet greater than or equal to 90% of their needs  MONITOR:   PO intake, Weight trends, Labs, I & O's  REASON FOR ASSESSMENT:   Malnutrition Screening Tool  ASSESSMENT:   82 y.o. female with a past medical history significant for dementia, IDDM, recently diagnosed pancreatic mass with apparent liver metastasis who presents with fall and hip pain and chest pain.  Pt seen for MST. BMI indicates normal weight. No intakes documented since admission. Pt with R hip fx s/p fall. Notes and rounds indicate that pt with pancreatic cancer; nutrition needs based on this dx. Family member in the hall way talking with EVS personnel at time of first visit. Social work Theatre manager in with family at time of second attempted visit.  Dr. Domingo Cocking met with great niece at bedside to begin discussion concerning POC/GOC. Spoke with him following his discussion with family and he states that great niece stated that should would like to take pt home and she is interested in learning more about hospice services. Dr. Domingo Cocking encouraged her to allow pt to eat and drink as she desires; RD highly in agreement with this. He states that pt was sleeping/resting/noninteractive during entirety of his visit and great niece reported that pt responds to Morphine in this way. RD did not enter room to give family privacy and to allow further interventions concerning POC/GOC to begin.   Pt likely not meeting needs at this time. Physical assessment not done at this time. Per chart review, pt has gained 5 lbs in  the past 1 month. Weight has been stable x2 months following a 9 lb weight loss (7% body weight) from 07/30/15-09/10/15 which is significant for time frame.  Medications reviewed. Labs reviewed; Na: 130 mmol/L, Cl: 97 mmol/L, creatinine low, Ca: 8.4 mg/dL. RD will monitor for additional needs.   Diet Order:  Diet heart healthy/carb modified Room service appropriate?: Yes; Fluid consistency:: Thin  Skin:  Reviewed, no issues  Last BM:  4/24  Height:   Ht Readings from Last 1 Encounters:  11/24/15 4' 11"  (1.499 m)    Weight:   Wt Readings from Last 1 Encounters:  11/24/15 114 lb 3.2 oz (51.8 kg)    Ideal Body Weight:  42.91 kg (kg)  BMI:  Body mass index is 23.05 kg/(m^2).  Estimated Nutritional Needs:   Kcal:  1550-1760 (30-34 kcal/kg)  Protein:  72-78 grams (1.4-1.5 grams/kg)  Fluid:  1.6-1.8 L/day  EDUCATION NEEDS:   No education needs identified at this time     Jarome Matin, RD, LDN Inpatient Clinical Dietitian Pager # [phone] After hours/weekend pager # [phone]

## 2015-11-24 NOTE — Care Management Note (Signed)
Case Management Note  Patient Details  Name: Christine Copeland MRN: [id number] Date of Birth: 1924/08/31  Subjective/Objective: Noted plans now are for residential hospice per niece-Bernadette, & [facility] on assessment, & acceptance-CSW notified.                   Action/Plan:d/c plan residential hospice.   Expected Discharge Date:                  Expected Discharge Plan:  [place]  In-House Referral:     Discharge planning Services  CM Consult  Post Acute Care Choice:    Choice offered to:  [facility] POA / Guardian  DME Arranged:    DME Agency:     HH Arranged:    [place] Agency:     Status of Service:  In process, will continue to follow  Medicare Important Message Given:    Date Medicare IM Given:    Medicare IM give by:    Date Additional Medicare IM Given:    Additional Medicare Important Message give by:     If discussed at [place] of Stay Meetings, dates discussed:    Additional Comments:  Dessa Phi, RN 11/24/2015, 2:50 PM

## 2015-11-24 NOTE — ED Notes (Signed)
Patient transported to X-ray 

## 2015-11-25 ENCOUNTER — Telehealth: Payer: Self-pay | Admitting: Physician Assistant

## 2015-11-25 ENCOUNTER — Ambulatory Visit: Payer: Medicare Other | Admitting: Physician Assistant

## 2015-11-25 DIAGNOSIS — I10 Essential (primary) hypertension: Secondary | ICD-10-CM

## 2015-11-25 DIAGNOSIS — S72001D Fracture of unspecified part of neck of right femur, subsequent encounter for closed fracture with routine healing: Secondary | ICD-10-CM

## 2015-11-25 LAB — COMPREHENSIVE METABOLIC PANEL
ALBUMIN: 2.8 g/dL — AB (ref 3.5–5.0)
ALT: 41 U/L (ref 14–54)
AST: 47 U/L — AB (ref 15–41)
Alkaline Phosphatase: 389 U/L — ABNORMAL HIGH (ref 38–126)
Anion gap: 11 (ref 5–15)
BILIRUBIN TOTAL: 1.1 mg/dL (ref 0.3–1.2)
BUN: 9 mg/dL (ref 6–20)
CO2: 20 mmol/L — AB (ref 22–32)
Calcium: 8.3 mg/dL — ABNORMAL LOW (ref 8.9–10.3)
Chloride: 96 mmol/L — ABNORMAL LOW (ref 101–111)
Creatinine, Ser: 0.49 mg/dL (ref 0.44–1.00)
GFR calc Af Amer: >60 mL/min (ref >60)
GFR calc non Af Amer: >60 mL/min (ref >60)
GLUCOSE: 192 mg/dL — AB (ref 65–99)
POTASSIUM: 3.5 mmol/L (ref 3.5–5.1)
SODIUM: 127 mmol/L — AB (ref 135–145)
TOTAL PROTEIN: 6.3 g/dL — AB (ref 6.5–8.1)

## 2015-11-25 LAB — GLUCOSE, CAPILLARY
GLUCOSE-CAPILLARY: 122 mg/dL — AB (ref 65–99)
Glucose-Capillary: 193 mg/dL — ABNORMAL HIGH (ref 65–99)

## 2015-11-25 LAB — CBC
HEMATOCRIT: 31.9 % — AB (ref 36.0–46.0)
Hemoglobin: 10.2 g/dL — ABNORMAL LOW (ref 12.0–15.0)
MCH: 28.5 pg (ref 26.0–34.0)
MCHC: 32 g/dL (ref 30.0–36.0)
MCV: 89.1 fL (ref 78.0–100.0)
PLATELETS: 266 10*3/uL (ref 150–400)
RBC: 3.58 MIL/uL — AB (ref 3.87–5.11)
RDW: 14.4 % (ref 11.5–15.5)
WBC: 13.6 10*3/uL — AB (ref 4.0–10.5)

## 2015-11-25 LAB — HEPARIN LEVEL (UNFRACTIONATED): Heparin Unfractionated: 0.32 IU/mL (ref 0.30–0.70)

## 2015-11-25 MED ORDER — ENOXAPARIN SODIUM 150 MG/ML ~~LOC~~ SOLN: 1.5000 mg/kg | SUBCUTANEOUS | Status: AC

## 2015-11-25 MED ORDER — ENOXAPARIN SODIUM 80 MG/0.8ML ~~LOC~~ SOLN
1.5000 mg/kg | SUBCUTANEOUS | Status: DC
  Administered 2015-11-25: 80 mg via SUBCUTANEOUS
  Filled 2015-11-25: qty 0.8

## 2015-11-25 MED ORDER — HYDROCODONE-ACETAMINOPHEN 5-325 MG PO TABS: 1.0000 | ORAL_TABLET | Freq: Four times a day (QID) | ORAL | Status: AC | PRN

## 2015-11-25 MED ORDER — MORPHINE SULFATE (PF) 2 MG/ML IV SOLN
1.0000 mg | INTRAVENOUS | Status: DC | PRN
  Administered 2015-11-25: 1 mg via INTRAVENOUS
  Filled 2015-11-25: qty 1

## 2015-11-25 NOTE — Discharge Summary (Signed)
Physician Discharge Summary  Christine Copeland [id number] DOB: 12/01/1924 DOA: 11/23/2015  PCP: Leeanne Rio, PA-C  Admit date: 11/23/2015 Discharge date: 11/25/2015  Time spent: 35 minutes  Recommendations for Outpatient Follow-up:  Patient to be transfer to residential hospice.   Discharge Diagnoses:    Displaced fracture of right femoral neck ([place])   Pulmonary embolism (H[facility])   Diabetes ([place])   Essential hypertension, benign   Dementia, senile with depression   Hyponatremia   Anemia, chronic disease   Pancreatic mass   Discharge Condition: stable  Diet recommendation: regular  Filed Weights   11/24/15 0306 11/24/15 0507  Weight: 50.5 kg (111 lb 5.3 oz) 51.8 kg (114 lb 3.2 oz)    History of present illness:  Christine Copeland is a 82 y.o. female with a past medical history significant for dementia, IDDM, recently diagnosed pancreatic mass with apparent liver metastasis who presents with fall and hip pain and chest pain.  All history collected from daughter and EDP given patient's dementia and confusion from acute illness. They report she was in stable health today, until tonight, at her assisted living facility when she had a fall, unwitnessed. Staff assisted her back to bed, but because she was crying from severe right hip pain and unable to bear weight, they called EMS for transport for suspected hip fracture.   In the ED, she was tachycardic, mildly hypoxic. She reported her chronic epigastric/abdominal pain (severe, constant, stable) and also new chest pain. Na 126 (baseline 134), K 4.1, Cr 0.4, WBC 8.8K, Hgb 9.5 (slightly below baseline ~11). Troponin and UA normal. ECG showed diffuse non-specific repolarization abnormalities, CXR unremarkable. RIGHT hip radiograph showed a displaced femoral neck fracture and the case was discussed with Dr. Rhona Raider who recommended medical admission because of suspected metastatic pancreatic cancer.  A CT angiogram was ordered  given her suspected cancer, chest pain and hypoxia which showed segmental and subsegmental RIGHT sided PE without heart strain, and the patient was started on heparin gtt. She has no personal history of VTE. TRH were asked to admit.  The patient has had about 4 months of epigastric and right sided abdominal pain. Lab work showed increased LFTs and a follow up RUQ US showed multifocal mass lesions, better imaged with CT 2 weeks ago (which also showed a pancreatic mass extending beyond the pancreas as well as peritoneal masses suspicious for metastasis as well). The patient is in the process of referral to both Hospice and Oncology.  Hospital Course:  Displaced right femoral neck fracture - Sustained post mechanical fall at assisted living facility. - Patient deemed high risk for surgery given acute PE in the context of suspected metastatic pancreatic cancer, advanced age and frail status. - Discussed at length with patient and her grand niece. They will meet with palliative care team for goals of care and are considering nonaggressive/non-surgical treatment and discharge home with hospice. - Orthopedic input appreciated: May consider 5-10 pounds of Buck's traction if experiences terrible pain. -will provide IV pain medications prior transfer.   Acute pulmonary embolism - In the context of suspected metastatic pancreatic cancer - Patient was hypoxic on arrival. -will transition to lovenox.   Suspected pancreatic cancer with liver and peritoneal metastasis/chronic abdominal pain. - Patient was referred to oncology and hospice as outpatient but yet to be seen. - Discussed at length with patient's grandniece who indicates that patient would not want biopsy nor chemotherapy. - Discussed with Dr Burr Medico , likely metastatic pancreatic cancer. I discussed with niece  she confirm that they don't want to pursue biopsy or treatment,   Anemia of chronic disease  Hyponatremia - stable. 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  IDDM - SSI to continue  Essential hypertension - Controlled  Dementia - Mental status probably at baseline.  Procedures:  none  Consultations:  palliative  Discharge Exam: Filed Vitals:   11/25/15 0606 11/25/15 0939  BP: 117/68 110/57  Pulse: 107 102  Temp: 98 F (36.7 C) 98.3 F (36.8 C)  Resp: 18 18    General: Alert in no acute distress Cardiovascular: S 1, S 2 RRR Respiratory: Crackles bases  Discharge Instructions   Discharge Instructions    Diet - low sodium heart healthy    Complete by:  As directed      Increase activity slowly    Complete by:  As directed           Current Discharge Medication List    START taking these medications   Details  enoxaparin (LOVENOX) 150 MG/ML injection Inject 0.52 mLs (80 mg total) into the skin daily. Qty: 22.4 Syringe, Refills: 4    HYDROcodone-acetaminophen (NORCO/VICODIN) 5-325 MG tablet Take 1-2 tablets by mouth every 6 (six) hours as needed for moderate pain. Qty: 30 tablet, Refills: 0      CONTINUE these medications which have NOT CHANGED   Details  Calcium Carbonate-Vitamin D (CALTRATE 600+D) 600-400 MG-UNIT tablet Take 1 tablet by mouth daily.    donepezil (ARICEPT) 10 MG tablet Take 1 tablet (10 mg total) by mouth at bedtime. Qty: 30 tablet, Refills: 3    fluticasone (FLONASE) 50 MCG/ACT nasal spray Place 1 spray into both nostrils at bedtime.     Insulin Glargine (BASAGLAR KWIKPEN) 100 UNIT/ML SOPN Inject 0.15 mLs (15 Units total) into the skin daily at 12 noon. Qty: 3 mL, Refills: 0    metoprolol succinate (TOPROL-XL) 25 MG 24 hr tablet TAKE ONE-HALF TABLET DAILY Qty: 15 tablet, Refills: 3    polyethylene glycol (MIRALAX / GLYCOLAX) packet Take 17 g by mouth at bedtime.     Psyllium 500 MG CAPS Take 500 mg by mouth at bedtime.    ranitidine (ZANTAC) 150 MG tablet Take 1 tablet (150 mg total) by mouth 2 (two) times daily. Reported on 09/10/2015 Qty: 60 tablet, Refills: 5     sertraline (ZOLOFT) 25 MG tablet TAKE ONE-HALF TABLET DAILY Qty: 15 tablet, Refills: 5      STOP taking these medications     ibuprofen (ADVIL,MOTRIN) 200 MG tablet      JANUVIA 50 MG tablet      losartan (COZAAR) 50 MG tablet      CALCIUM 600/VITAMIN D3 600-800 MG-UNIT TABS      glucose blood (ONETOUCH VERIO) test strip      Lancets (ONETOUCH ULTRASOFT) lancets      QC CHILDRENS ASPIRIN 81 MG chewable tablet      QC NON-ASPIRIN EXTRA STRENGTH 500 MG tablet        No Known Allergies    The results of significant diagnostics from this hospitalization (including imaging, microbiology, ancillary and laboratory) are listed below for reference.    Significant Diagnostic Studies: Dg Chest 1 View  11/24/2015  CLINICAL DATA:  Preop.  Fractured left hip EXAM: CHEST 1 VIEW COMPARISON:  None. FINDINGS: Study limited by leftward rotation and chin overlapping the left apex. Patchy streaky bilateral lung opacity at the bases and in the right upper lobe. Lung volumes are low. No cardiomegaly when accounting for leftward  rotation. No edema, effusion, or pneumothorax. Osteopenia. IMPRESSION: Bilateral atelectasis or aspiration, greatest in the retrocardiac lung. Electronically Signed   By: Monte Fantasia M.D.   On: 11/24/2015 01:09   Ct Angio Chest Pe W/cm &/or Wo Cm  11/24/2015  CLINICAL DATA:  Status post fall, with hypoxia and generalized chest soreness. Initial encounter. EXAM: CT ANGIOGRAPHY CHEST WITH CONTRAST TECHNIQUE: Multidetector CT imaging of the chest was performed using the standard protocol during bolus administration of intravenous contrast. Multiplanar CT image reconstructions and MIPs were obtained to evaluate the vascular anatomy. CONTRAST:  80 mL of Isovue 370 IV contrast COMPARISON:  Chest radiograph performed earlier today at 12:24 a.m. FINDINGS: Pulmonary embolus is noted within segmental and subsegmental branches to the right upper and lower lung lobes. There is  suggestion of a small peripheral pulmonary infarct at the right upper lobe. Trace bilateral pleural effusions are noted. Mild left basilar airspace opacity may reflect atelectasis or possibly mild infection. Mild right basilar atelectasis is noted. No pneumothorax is identified. No masses are identified; no abnormal focal contrast enhancement is seen. The mediastinum is unremarkable in appearance. No mediastinal lymphadenopathy is seen. No pericardial effusion is identified. The great vessels are grossly unremarkable in appearance. No axillary lymphadenopathy is seen. The thyroid gland is unremarkable in appearance. Vague hypodensities within the liver reflect the patient's known hepatic metastases. Trace ascites noted about the liver and spleen. No acute osseous abnormalities are seen. Facet disease is noted at the lower cervical spine. Review of the MIP images confirms the above findings. IMPRESSION: 1. Pulmonary embolus within segmental and subsegmental branches to the right upper and lower lung lobes. 2. Suggestion of small peripheral pulmonary infarct at the right upper lobe. 3. Mild left basilar airspace opacity may reflect atelectasis or possibly mild infection. 4. Trace bilateral pleural effusions, with mild right basilar atelectasis. 5. Vague hypodensities within the liver reflect the patient's known hepatic metastases. 6. Trace ascites noted about the liver and spleen. Critical Value/emergent results were called by telephone at the time of interpretation on 11/24/2015 at 2:58 am to Dr. Theotis Burrow, who verbally acknowledged these results. Electronically Signed   By: Garald Balding M.D.   On: 11/24/2015 03:30   Ct Abdomen Pelvis W Contrast  11/13/2015  CLINICAL DATA:  Abdominal tenderness and loss of appetite. EXAM: CT ABDOMEN AND PELVIS WITH CONTRAST TECHNIQUE: Multidetector CT imaging of the abdomen and pelvis was performed using the standard protocol following bolus administration of intravenous  contrast. CONTRAST:  163mL ISOVUE-300 IOPAMIDOL (ISOVUE-300) INJECTION 61% COMPARISON:  Abdominal ultrasound 11/06/2015 FINDINGS: Lower chest and abdominal wall: Supraumbilical hernia containing fat, omental vessels, and peritoneal carcinomatosis. Hepatobiliary: Numerous ill-defined hypo enhancing masses throughout the liver. The largest is in segment 7 measuring ~3 cm.Cholecystectomy with negative common bile duct Pancreas: Infiltrating hypo enhancing mass in the pancreatic body with extension beyond the parenchymal margins. Cystic change at the tail is dilated main duct. Appearance is consistent with adenocarcinoma of the pancreas. There is wispy density infiltrating towards the celiac ganglia. No definitive tumor seen around the celiac or SMA vessels branches. Liver metastases as described above. There is also peritoneal carcinomatosis with nodularity of the omentum and right upper quadrant with trace perihepatic and pelvic fluid. Peritoneal tumor likely deposit along the lower margin of the sigmoid colon a there is abnormal eccentric soft tissue. Spleen: Unremarkable. Adrenals/Urinary Tract: Negative adrenals. No hydronephrosis or stone. Unremarkable bladder. Reproductive:Negative Stomach/Bowel: No obstruction or inflammation. Incidental non rotation of bowel. Distal colonic diverticulosis.  Vascular/Lymphatic: No acute vascular abnormality. Peritoneal: No ascites or pneumoperitoneum. Musculoskeletal: No acute abnormalities. No metastatic lesions seen. Degenerative disease and levoscoliosis. These results will be called to the ordering clinician or representative by the Radiologist Assistant, and communication documented in the PACS or zVision Dashboard. IMPRESSION: Pancreas adenocarcinoma with liver and peritoneal metastases. Electronically Signed   By: Monte Fantasia M.D.   On: 11/13/2015 12:53   Dg Hip Unilat  With Pelvis 2-3 Views Right  11/23/2015  CLINICAL DATA:  Unwitnessed fall at nursing home with  right hip deformity. Initial encounter. EXAM: DG HIP (WITH OR WITHOUT PELVIS) 2-3V RIGHT COMPARISON:  None. FINDINGS: Acute right femoral neck fracture with proximal displacement. No notable degenerative changes to the acetabulum. No evidence of pelvic ring fracture or diastasis. Osteopenia. No metastatic lesion seen in this location on 11/13/2015 CT. IMPRESSION: Displaced right femoral neck fracture. Electronically Signed   By: Monte Fantasia M.D.   On: 11/23/2015 23:58   US Abdomen Limited Ruq  11/06/2015  CLINICAL DATA:  Elevated liver enzymes EXAM: US ABDOMEN LIMITED - RIGHT UPPER QUADRANT COMPARISON:  None. FINDINGS: Gallbladder: Not visualized.  Question absence. Common bile duct: Diameter: 7 mm. No intrahepatic or extrahepatic biliary duct dilatation. Liver: The overall liver echotexture is inhomogeneous. There is a 2.0 x 2.2 x 1.8 cm solid mass with hypoechoic rim in the anterior segment lobe of the liver. In the medial segment left lobe of the liver, there is a 2.1 x 2.1 x 1.9 cm mass with hypoechoic rim. In the posterior segment of the right lobe of the liver, there is a slightly inhomogeneous but predominantly hypoechoic mass. A small amount of ascites is noted adjacent to the liver edge. IMPRESSION: Mass lesions in the liver suspicious for metastatic foci. Small amount of ascites. Gallbladder not visualized. Question absence. No biliary duct dilatation. No biliary duct mass or calculus evident by ultrasound. These findings raise concern for underlying neoplasm, likely metastases in the liver. These results will be called to the ordering clinician or representative by the Radiologist Assistant, and communication documented in the PACS or zVision Dashboard. Electronically Signed   By: Lowella Grip III M.D.   On: 11/06/2015 15:44    Microbiology: Recent Results (from the past 240 hour(s))  MRSA PCR Screening     Status: None   Collection Time: 11/24/15  5:58 AM  Result Value Ref Range Status    MRSA by PCR NEGATIVE NEGATIVE Final    Comment:        The GeneXpert MRSA Assay (FDA approved for NASAL specimens only), is one component of a comprehensive MRSA colonization surveillance program. It is not intended to diagnose MRSA infection nor to guide or monitor treatment for MRSA infections.      Labs: Basic Metabolic Panel:  Recent Labs Lab 11/24/15 0015 11/24/15 0518 11/25/15 0514  NA 126* 130* 127*  K 4.1 3.7 3.5  CL 97* 97* 96*  CO2 25 23 20*  GLUCOSE 176* 150* 192*  BUN 10 8 9   CREATININE 0.42* 0.41* 0.49  CALCIUM 8.9 8.4* 8.3*   Liver Function Tests:  Recent Labs Lab 11/25/15 0514  AST 47*  ALT 41  ALKPHOS 389*  BILITOT 1.1  PROT 6.3*  ALBUMIN 2.8*   No results for input(s): LIPASE, AMYLASE in the last 168 hours. No results for input(s): AMMONIA in the last 168 hours. CBC:  Recent Labs Lab 11/24/15 0015 11/25/15 0514  WBC 8.8 13.6*  NEUTROABS 7.3  --   HGB  9.5* 10.2*  HCT 29.1* 31.9*  MCV 87.1 89.1  PLT 248 266   Cardiac Enzymes:  Recent Labs Lab 11/24/15 0015  TROPONINI <0.03   BNP: BNP (last 3 results) No results for input(s): BNP in the last 8760 hours.  ProBNP (last 3 results) No results for input(s): PROBNP in the last 8760 hours.  CBG:  Recent Labs Lab 11/24/15 1644 11/24/15 1743 11/24/15 2221 11/25/15 0750 11/25/15 1234  GLUCAP 65 98 138* 193* 122*       Signed:  Niel Hummer A MD.  [facility] 11/25/2015, 1:38 PM

## 2015-11-25 NOTE — Telephone Encounter (Signed)
Called and [facility]  @ 1:31pm @ [phone]) for [name] with ([place]) to RTC regarding note below.//AB/CMA

## 2015-11-25 NOTE — Telephone Encounter (Signed)
I faxed to them yesterday afternoon. Please check on this.

## 2015-11-25 NOTE — Progress Notes (Signed)
CSW received referral from [facility], Juliann Pulse that patient's niece is interested in residential hospice for patient. CSW made referral to [facility], awaiting call back re: bed availability/elibility.    Raynaldo Opitz, [facility] Clinical Social Worker cell #: [phone]

## 2015-11-25 NOTE — Care Management Note (Signed)
Case Management Note  Patient Details  Name: Christine Copeland MRN: [id number] Date of Birth: 02/27/1925  Subjective/Objective:                    Action/Plan:d/c residential hospice.   Expected Discharge Date:                  Expected Discharge Plan:  [place]  In-House Referral:     Discharge planning Services  CM Consult  Post Acute Care Choice:    Choice offered to:  [facility] POA / Guardian  DME Arranged:    DME Agency:     HH Arranged:    [place] Agency:     Status of Service:  Completed, signed off  Medicare Important Message Given:    Date Medicare IM Given:    Medicare IM give by:    Date Additional Medicare IM Given:    Additional Medicare Important Message give by:     If discussed at [place] of Stay Meetings, dates discussed:    Additional Comments:  Dessa Phi, RN 11/25/2015, 1:49 PM

## 2015-11-25 NOTE — Progress Notes (Signed)
Oncology brief note   I was called by hospitalist Dr. Algis Liming yesterday to comments on pt's CT scan findings. I did not meet pt and her family.  I reviewed the CT scan from 11/24/2015 and 11/13/2015 and discussed with radiologist Dr. Maryland Pink who also reviewed the scans carefully. The scan findings are highly suspicious for metastatic pancreatic adenocarcinoma to liver and peritoneum. Other type pancreatic malignancy, such as neuroendocrine tumor, or other metastatic cancer to pancrease, felt to be much less likely. I was informed that pt and her family would like to pursue palliative care and hospice, which I think would be very reasonable.   Truitt Merle  11/25/2015

## 2015-11-25 NOTE — Progress Notes (Signed)
Patient is set to discharge to [facility] today. Patient & niece at bedside made aware. Discharge packet given to RN, Joelene Millin. [facility] called for transport.     Raynaldo Opitz, [facility] Clinical Social Worker cell #: [phone]

## 2015-11-25 NOTE — Telephone Encounter (Signed)
Caller name: [name] from [facility]  Call back number: [phone] fax # [phone]   Reason for call:  Faxing incident report to [phone] requesting PCP signature and faxed back [phone]

## 2015-11-25 NOTE — Progress Notes (Addendum)
ANTICOAGULATION CONSULT NOTE - Follow Up Consult  Pharmacy Consult for Heparin --> Lovenox Indication: pulmonary embolus  No Known Allergies  Patient Measurements: Height: 4\' 11"  (149.9 cm) Weight: 114 lb 3.2 oz (51.8 kg) IBW/kg (Calculated) : 43.2 Heparin Dosing Weight: 50 kg  Vital Signs: Temp: 98 F (36.7 C) (04/26 0606) Temp Source: Oral (04/26 0606) BP: 117/68 mmHg (04/26 0606) Pulse Rate: 107 (04/26 0606)  Labs:  Recent Labs  11/24/15 0015 11/24/15 0518 11/24/15 1210 11/24/15 1935 11/25/15 0514  HGB 9.5*  --   --   --  10.2*  HCT 29.1*  --   --   --  31.9*  PLT 248  --   --   --  266  LABPROT 15.1  --   --   --   --   INR 1.17  --   --   --   --   HEPARINUNFRC  --   --  0.35 <0.10* 0.32  CREATININE 0.42* 0.41*  --   --  0.49  TROPONINI <0.03  --   --   --   --     Estimated Creatinine Clearance: 33.2 mL/min (by C-G formula based on Cr of 0.49).   Medical History: Past Medical History  Diagnosis Date  . Diabetes ([place])   . GERD (gastroesophageal reflux disease)   . Idiopathic small intestinal ulcers   . Seasonal allergies   . Hypertension   . Hiatal hernia   . Degenerative arthritis of knee, bilateral     Medications:  Scheduled:  . antiseptic oral rinse  7 mL Mouth Rinse q12n4p  . chlorhexidine  15 mL Mouth Rinse BID  . donepezil  10 mg Oral QHS  . famotidine  20 mg Oral BID  . fluticasone  1 spray Each Nare QHS  . insulin aspart  0-5 Units Subcutaneous QHS  . insulin aspart  0-9 Units Subcutaneous TID WC  . insulin glargine  15 Units Subcutaneous Q1200  . losartan  25 mg Oral Daily  . metoprolol succinate  12.5 mg Oral Daily  . polyethylene glycol  17 g Oral QHS  . sertraline  12.5 mg Oral Daily   Infusions:  . heparin 800 Units/hr (11/24/15 1950)    Assessment: 82 yr female brought to ED s/p fall.  Xray shows femoral neck fracture.  Recently diagnosed with pancreatic tumor.  Pt with complaint of chest pain.  CTAngio shows several small  PE's.  IV heparin per pharmacy dosing ordered.  Patient on no oral anticoagulation PTA.  Today, 11/25/2015: - Heparin level therapeutic at 0.32, however, at the lower end of therapeutic range. Infusion currently at 800 units/hr. - Hgb, platelets stable - No bleeding/complications reported.   Goal of Therapy:  Heparin level 0.3-0.7 units/ml Monitor platelets by anticoagulation protocol: Yes   Plan:   Increase heparin infusion slightly to 850 units/hr.  Recheck heparin level in 8 hours.    Follow heparin level & CBC daily  Hershal Coria, PharmD 11/25/2015,7:13 AM   Addendum: 11/25/2015 12:52 PM Pharmacy consulted to transition patient to Lovenox.  CrCl~33 ml/min and stable.  Plan: Stop heparin infusion. Start Lovenox 1 hour after heparin infusion stopped.  Start Lovenox 80 mg (1.5 mg/kg) SQ q24h.   Monitor SCr, CBC.  Hershal Coria, PharmD, BCPS Pager: [phone] 11/25/2015 12:55 PM

## 2015-11-25 NOTE — Progress Notes (Signed)
Patient discharging to hospice via [facility], family in room, prn pain med given prior to transport and IV removed

## 2015-11-26 ENCOUNTER — Encounter: Payer: Self-pay | Admitting: Endocrinology

## 2015-11-26 NOTE — Telephone Encounter (Signed)
Called and left a message for [facility] to call us back if they have not received the report yet.

## 2015-11-30 ENCOUNTER — Encounter: Payer: Self-pay | Admitting: Endocrinology

## 2015-12-07 ENCOUNTER — Ambulatory Visit: Payer: Medicare Other | Admitting: Endocrinology

## 2015-12-08 ENCOUNTER — Ambulatory Visit: Payer: Medicare Other | Admitting: Endocrinology

## 2015-12-31 DEATH — deceased

## 2016-09-10 IMAGING — CR DG CHEST 1V
1 series · 1 of 1 positions shown · non-contrast
Comparison: None.

CLINICAL DATA: Preop.  Fractured left hip

EXAM:
CHEST 1 VIEW

[x chest ap]
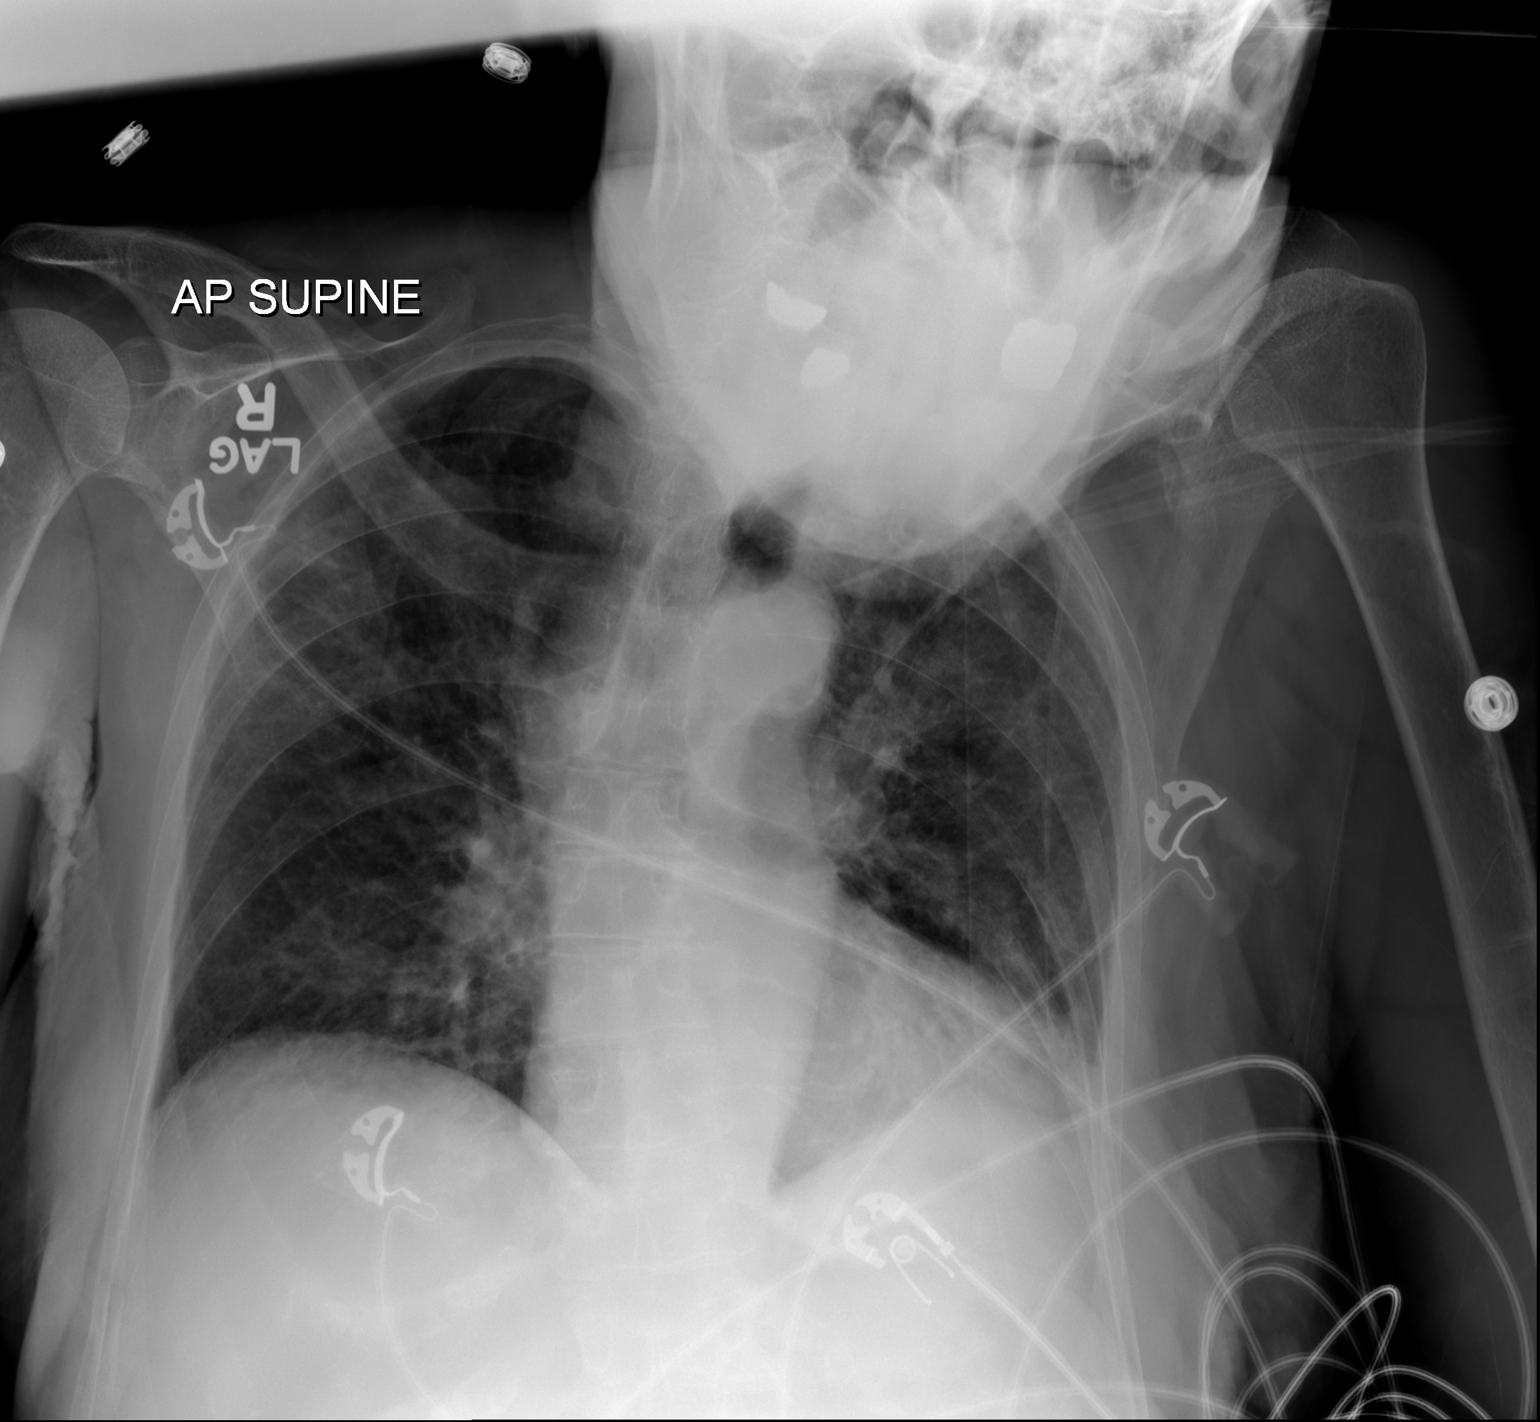

[1 of 1 positions shown; findings below may reference images not displayed]

FINDINGS: Study limited by leftward rotation and chin overlapping the left
apex.

Patchy streaky bilateral lung opacity at the bases and in the right
upper lobe. Lung volumes are low. No cardiomegaly when accounting
for leftward rotation. No edema, effusion, or pneumothorax.
Osteopenia.
IMPRESSION: Bilateral atelectasis or aspiration, greatest in the retrocardiac
lung.

## 2016-09-10 IMAGING — CT CT ANGIO CHEST
2 of 6 series · 18 of 36 positions shown · IV contrast (isovue)
Comparison: Chest radiograph performed earlier today at [DATE] a.m.

CLINICAL DATA: Status post fall, with hypoxia and generalized chest
soreness. Initial encounter.

EXAM:
CT ANGIOGRAPHY CHEST WITH CONTRAST
TECHNIQUE: Multidetector CT imaging of the chest was performed using the
standard protocol during bolus administration of intravenous
contrast. Multiplanar CT image reconstructions and MIPs were
obtained to evaluate the vascular anatomy.
CONTRAST:  80 mL of Isovue 370 IV contrast

[Series 5: thins for pacs · axial · 0.54mm/px · z∈[+1943,+2123]mm · 17 of 202 slices shown]
[im 11/202  lung]
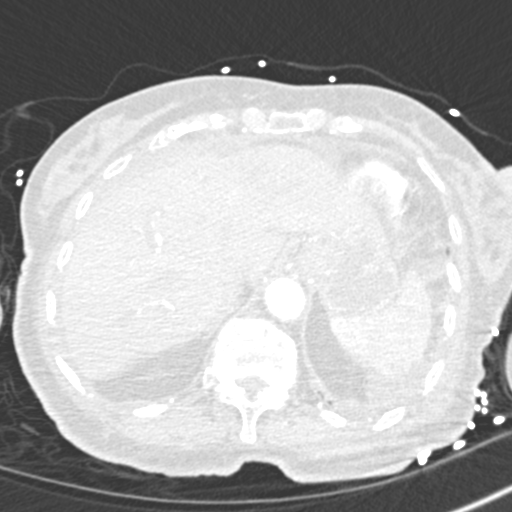
[im 21/202  mediastinal]
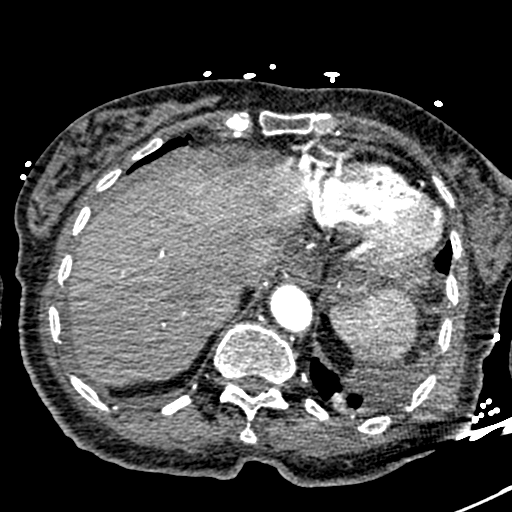
[im 31/202  lung]
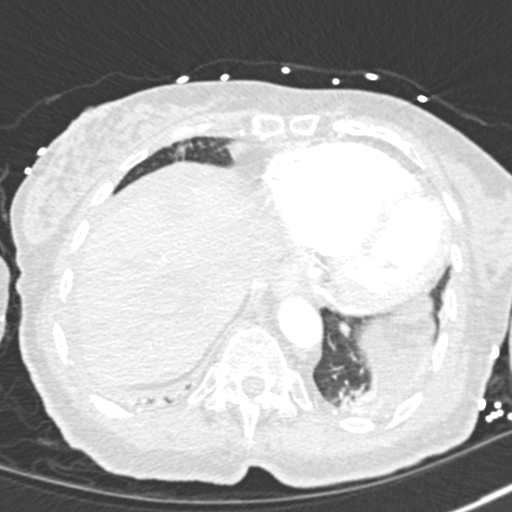
[im 41/202  mediastinal]
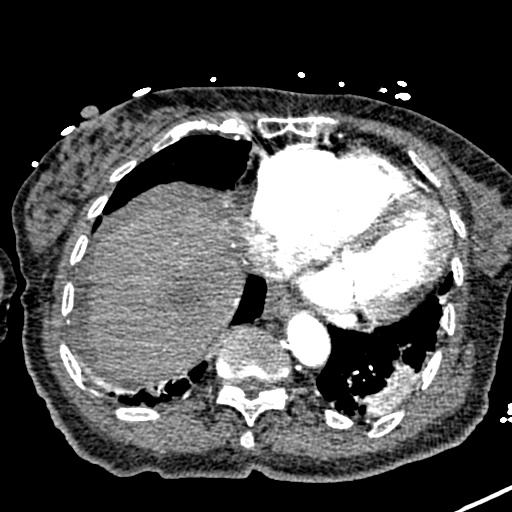
[im 61/202  lung]
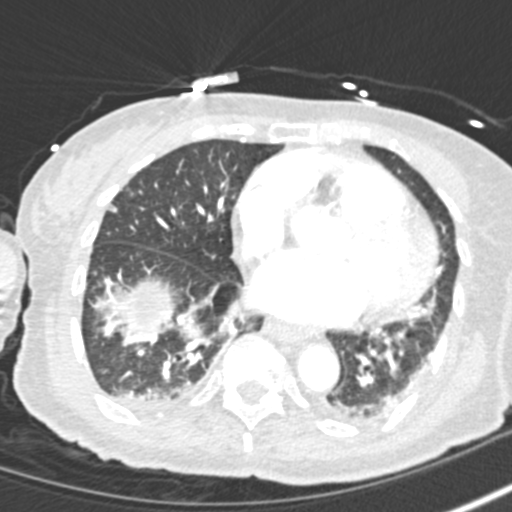
[im 71/202  mediastinal]
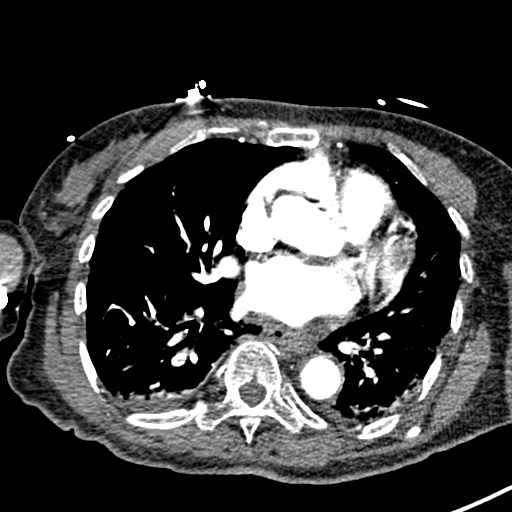
[im 81/202  lung]
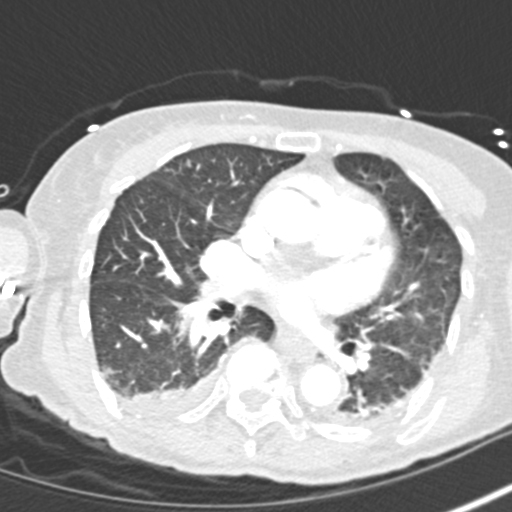
[im 91/202  mediastinal]
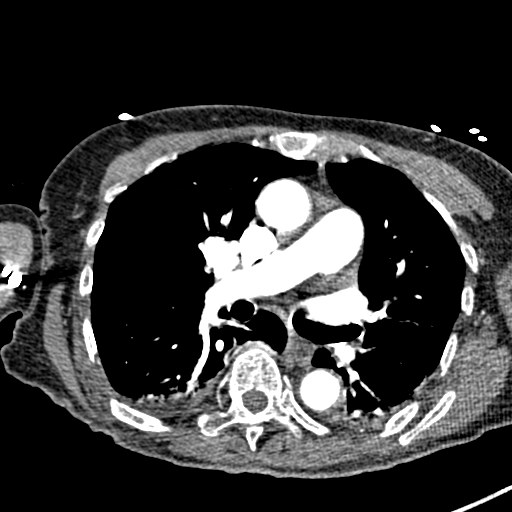
[im 101/202  lung]
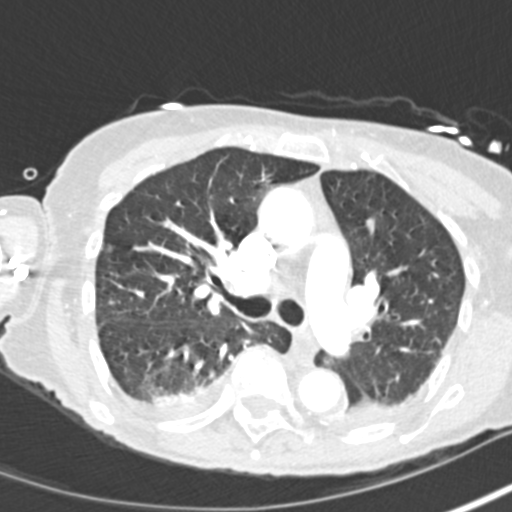
[im 111/202  mediastinal]
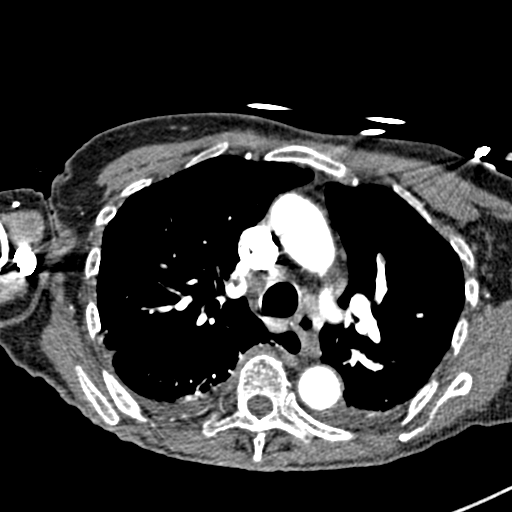
[im 121/202  lung]
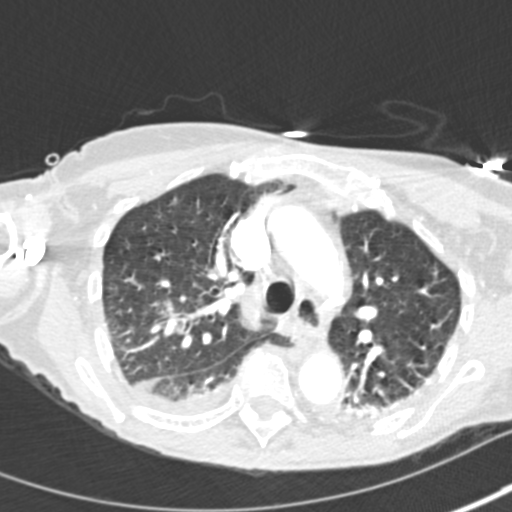
[im 131/202  mediastinal]
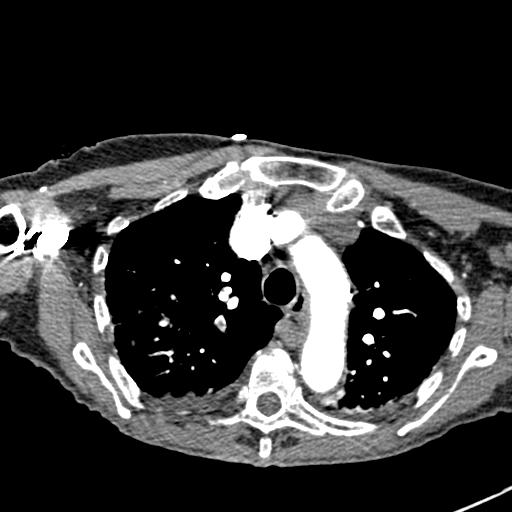
[im 141/202  lung]
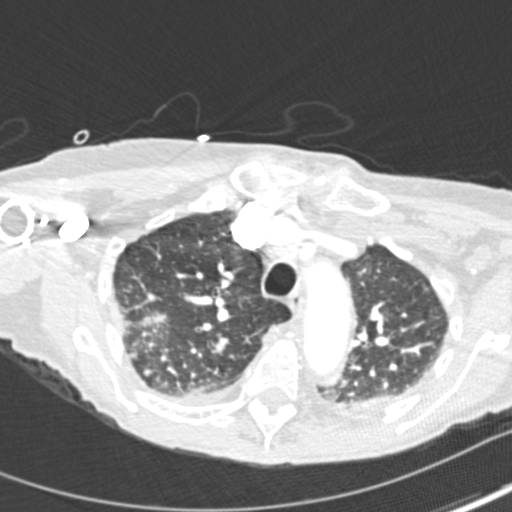
[im 161/202  mediastinal]
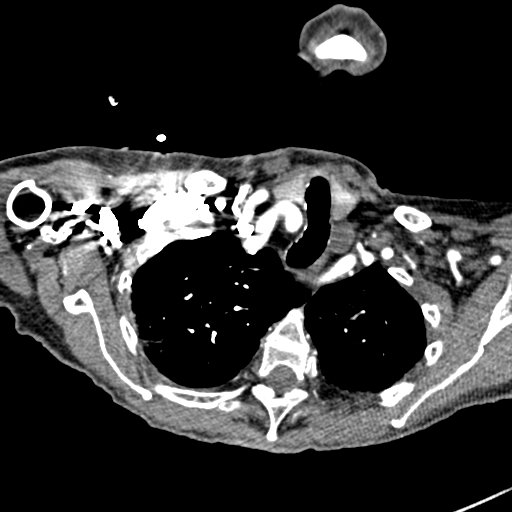
[im 171/202  lung]
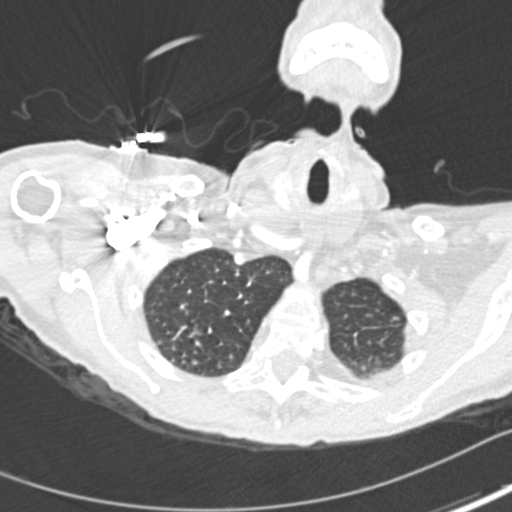
[im 181/202  mediastinal]
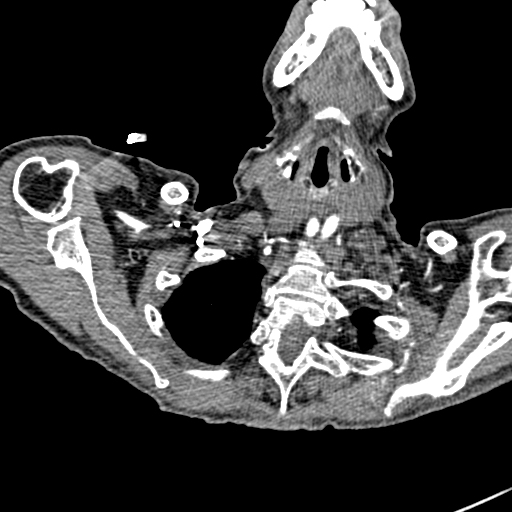
[im 191/202  lung]
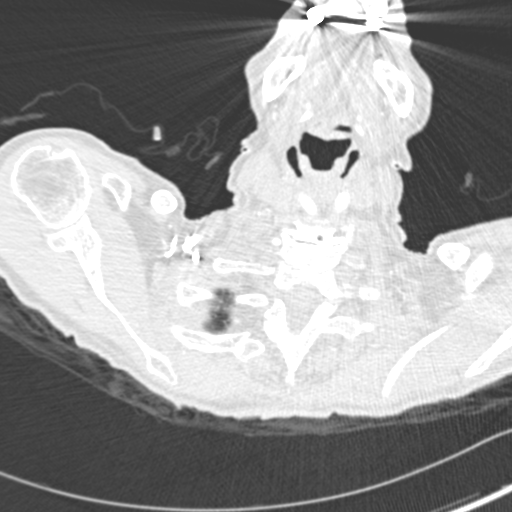

[Series 7: coronal mpr · coronal · 0.39mm/px · 1 of 96 slices shown]
[im 48/96  mediastinal]
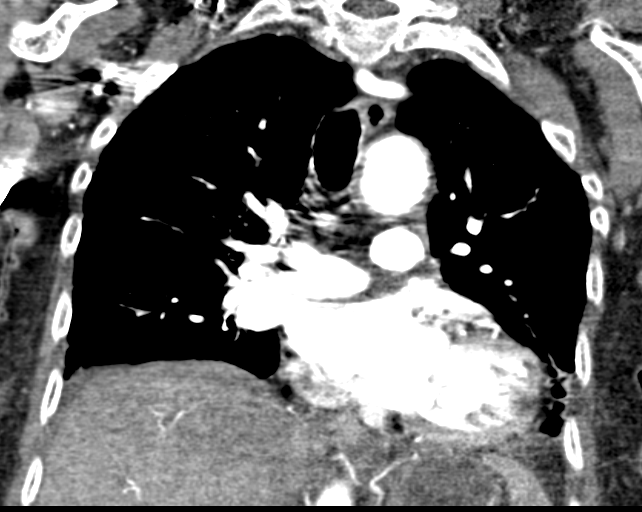

[18 of 36 positions shown; findings below may reference images not displayed]

FINDINGS: Pulmonary embolus is noted within segmental and subsegmental
branches to the right upper and lower lung lobes. There is
suggestion of a small peripheral pulmonary infarct at the right
upper lobe. Trace bilateral pleural effusions are noted. Mild left
basilar airspace opacity may reflect atelectasis or possibly mild
infection. Mild right basilar atelectasis is noted.

No pneumothorax is identified. No masses are identified; no abnormal
focal contrast enhancement is seen.

The mediastinum is unremarkable in appearance. No mediastinal
lymphadenopathy is seen. No pericardial effusion is identified. The
great vessels are grossly unremarkable in appearance. No axillary
lymphadenopathy is seen. The thyroid gland is unremarkable in
appearance.

Vague hypodensities within the liver reflect the patient's known
hepatic metastases. Trace ascites noted about the liver and spleen.

No acute osseous abnormalities are seen. Facet disease is noted at
the lower cervical spine.

Review of the MIP images confirms the above findings.
IMPRESSION: 1. Pulmonary embolus within segmental and subsegmental branches to
the right upper and lower lung lobes.
2. Suggestion of small peripheral pulmonary infarct at the right
upper lobe.
3. Mild left basilar airspace opacity may reflect atelectasis or
possibly mild infection.
4. Trace bilateral pleural effusions, with mild right basilar
atelectasis.
5. Vague hypodensities within the liver reflect the patient's known
hepatic metastases.
6. Trace ascites noted about the liver and spleen.

Critical Value/emergent results were called by telephone at the time
of interpretation on 11/24/2015 at [DATE] to Dr. O CARA SASOMA, who
verbally acknowledged these results.
